# Patient Record
Sex: Male | Born: 1950 | Race: White | Hispanic: No | Marital: Single | Smoking: Current every day smoker
Health system: Southern US, Community
[De-identification: ages and names within clinical notes are randomized; demographics above are authoritative.]

## PROBLEM LIST (undated history)

## (undated) DIAGNOSIS — M5126 Other intervertebral disc displacement, lumbar region: Secondary | ICD-10-CM

## (undated) DIAGNOSIS — M1990 Unspecified osteoarthritis, unspecified site: Secondary | ICD-10-CM

## (undated) DIAGNOSIS — J449 Chronic obstructive pulmonary disease, unspecified: Secondary | ICD-10-CM

## (undated) DIAGNOSIS — E7800 Pure hypercholesterolemia, unspecified: Secondary | ICD-10-CM

## (undated) DIAGNOSIS — I10 Essential (primary) hypertension: Secondary | ICD-10-CM

## (undated) DIAGNOSIS — K219 Gastro-esophageal reflux disease without esophagitis: Secondary | ICD-10-CM

## (undated) DIAGNOSIS — I639 Cerebral infarction, unspecified: Secondary | ICD-10-CM

## (undated) DIAGNOSIS — I509 Heart failure, unspecified: Secondary | ICD-10-CM

## (undated) DIAGNOSIS — J42 Unspecified chronic bronchitis: Secondary | ICD-10-CM

## (undated) DIAGNOSIS — Z9289 Personal history of other medical treatment: Secondary | ICD-10-CM

## (undated) DIAGNOSIS — M5136 Other intervertebral disc degeneration, lumbar region: Secondary | ICD-10-CM

## (undated) HISTORY — PX: TONSILLECTOMY: SUR1361

---

## 2008-12-30 DIAGNOSIS — Z9289 Personal history of other medical treatment: Secondary | ICD-10-CM

## 2008-12-30 HISTORY — DX: Personal history of other medical treatment: Z92.89

## 2011-12-31 HISTORY — PX: CARDIAC CATHETERIZATION: SHX172

## 2014-04-29 HISTORY — PX: COLONOSCOPY: SHX174

## 2014-10-12 ENCOUNTER — Emergency Department (HOSPITAL_COMMUNITY): Payer: Medicaid Other

## 2014-10-12 ENCOUNTER — Inpatient Hospital Stay (HOSPITAL_COMMUNITY)
Admission: EM | Admit: 2014-10-12 | Discharge: 2014-10-28 | DRG: 286 | Disposition: A | Discharge status: Home Health | Payer: Medicaid Other | Attending: Internal Medicine | Admitting: Internal Medicine

## 2014-10-12 ENCOUNTER — Encounter (HOSPITAL_COMMUNITY): Payer: Self-pay | Admitting: Emergency Medicine

## 2014-10-12 DIAGNOSIS — E43 Unspecified severe protein-calorie malnutrition: Secondary | ICD-10-CM | POA: Diagnosis present

## 2014-10-12 DIAGNOSIS — Z66 Do not resuscitate: Secondary | ICD-10-CM | POA: Diagnosis present

## 2014-10-12 DIAGNOSIS — Z720 Tobacco use: Secondary | ICD-10-CM

## 2014-10-12 DIAGNOSIS — E780 Pure hypercholesterolemia: Secondary | ICD-10-CM | POA: Diagnosis present

## 2014-10-12 DIAGNOSIS — R000 Tachycardia, unspecified: Secondary | ICD-10-CM

## 2014-10-12 DIAGNOSIS — I5023 Acute on chronic systolic (congestive) heart failure: Secondary | ICD-10-CM | POA: Diagnosis present

## 2014-10-12 DIAGNOSIS — Z681 Body mass index (BMI) 19 or less, adult: Secondary | ICD-10-CM

## 2014-10-12 DIAGNOSIS — M549 Dorsalgia, unspecified: Secondary | ICD-10-CM | POA: Diagnosis present

## 2014-10-12 DIAGNOSIS — E871 Hypo-osmolality and hyponatremia: Secondary | ICD-10-CM | POA: Diagnosis not present

## 2014-10-12 DIAGNOSIS — F411 Generalized anxiety disorder: Secondary | ICD-10-CM | POA: Diagnosis present

## 2014-10-12 DIAGNOSIS — I2510 Atherosclerotic heart disease of native coronary artery without angina pectoris: Secondary | ICD-10-CM | POA: Diagnosis present

## 2014-10-12 DIAGNOSIS — J9601 Acute respiratory failure with hypoxia: Secondary | ICD-10-CM

## 2014-10-12 DIAGNOSIS — M79669 Pain in unspecified lower leg: Secondary | ICD-10-CM

## 2014-10-12 DIAGNOSIS — R64 Cachexia: Secondary | ICD-10-CM | POA: Diagnosis present

## 2014-10-12 DIAGNOSIS — I10 Essential (primary) hypertension: Secondary | ICD-10-CM | POA: Diagnosis present

## 2014-10-12 DIAGNOSIS — R0602 Shortness of breath: Secondary | ICD-10-CM | POA: Diagnosis present

## 2014-10-12 DIAGNOSIS — F419 Anxiety disorder, unspecified: Secondary | ICD-10-CM | POA: Diagnosis present

## 2014-10-12 DIAGNOSIS — R570 Cardiogenic shock: Secondary | ICD-10-CM | POA: Diagnosis present

## 2014-10-12 DIAGNOSIS — J9600 Acute respiratory failure, unspecified whether with hypoxia or hypercapnia: Secondary | ICD-10-CM | POA: Diagnosis present

## 2014-10-12 DIAGNOSIS — J449 Chronic obstructive pulmonary disease, unspecified: Secondary | ICD-10-CM | POA: Diagnosis present

## 2014-10-12 DIAGNOSIS — Z23 Encounter for immunization: Secondary | ICD-10-CM | POA: Diagnosis not present

## 2014-10-12 DIAGNOSIS — D509 Iron deficiency anemia, unspecified: Secondary | ICD-10-CM | POA: Diagnosis present

## 2014-10-12 DIAGNOSIS — Z515 Encounter for palliative care: Secondary | ICD-10-CM

## 2014-10-12 DIAGNOSIS — I429 Cardiomyopathy, unspecified: Secondary | ICD-10-CM | POA: Diagnosis present

## 2014-10-12 DIAGNOSIS — R911 Solitary pulmonary nodule: Secondary | ICD-10-CM | POA: Diagnosis present

## 2014-10-12 DIAGNOSIS — Z5987 Material hardship due to limited financial resources, not elsewhere classified: Secondary | ICD-10-CM

## 2014-10-12 DIAGNOSIS — R109 Unspecified abdominal pain: Secondary | ICD-10-CM

## 2014-10-12 DIAGNOSIS — F17210 Nicotine dependence, cigarettes, uncomplicated: Secondary | ICD-10-CM | POA: Diagnosis present

## 2014-10-12 DIAGNOSIS — Z452 Encounter for adjustment and management of vascular access device: Secondary | ICD-10-CM

## 2014-10-12 DIAGNOSIS — K219 Gastro-esophageal reflux disease without esophagitis: Secondary | ICD-10-CM | POA: Diagnosis present

## 2014-10-12 DIAGNOSIS — R1031 Right lower quadrant pain: Secondary | ICD-10-CM

## 2014-10-12 DIAGNOSIS — G8929 Other chronic pain: Secondary | ICD-10-CM | POA: Diagnosis present

## 2014-10-12 DIAGNOSIS — I69351 Hemiplegia and hemiparesis following cerebral infarction affecting right dominant side: Secondary | ICD-10-CM | POA: Diagnosis not present

## 2014-10-12 DIAGNOSIS — Z598 Other problems related to housing and economic circumstances: Secondary | ICD-10-CM

## 2014-10-12 DIAGNOSIS — R0600 Dyspnea, unspecified: Secondary | ICD-10-CM | POA: Diagnosis present

## 2014-10-12 DIAGNOSIS — I509 Heart failure, unspecified: Secondary | ICD-10-CM

## 2014-10-12 HISTORY — DX: Personal history of other medical treatment: Z92.89

## 2014-10-12 HISTORY — DX: Other intervertebral disc displacement, lumbar region: M51.26

## 2014-10-12 HISTORY — DX: Pure hypercholesterolemia, unspecified: E78.00

## 2014-10-12 HISTORY — DX: Heart failure, unspecified: I50.9

## 2014-10-12 HISTORY — DX: Essential (primary) hypertension: I10

## 2014-10-12 HISTORY — DX: Gastro-esophageal reflux disease without esophagitis: K21.9

## 2014-10-12 HISTORY — DX: Chronic obstructive pulmonary disease, unspecified: J44.9

## 2014-10-12 HISTORY — DX: Unspecified chronic bronchitis: J42

## 2014-10-12 HISTORY — DX: Other intervertebral disc degeneration, lumbar region: M51.36

## 2014-10-12 HISTORY — DX: Cerebral infarction, unspecified: I63.9

## 2014-10-12 HISTORY — DX: Unspecified osteoarthritis, unspecified site: M19.90

## 2014-10-12 LAB — LIPID PANEL
CHOL/HDL RATIO: 5.6 ratio
Cholesterol: 173 mg/dL (ref 0–200)
HDL: 31 mg/dL — ABNORMAL LOW (ref >39)
LDL Cholesterol: 123 mg/dL — ABNORMAL HIGH (ref 0–99)
TRIGLYCERIDES: 93 mg/dL (ref <150)
VLDL: 19 mg/dL (ref 0–40)

## 2014-10-12 LAB — BASIC METABOLIC PANEL
Anion gap: 16 — ABNORMAL HIGH (ref 5–15)
BUN: 26 mg/dL — AB (ref 6–23)
CALCIUM: 9.1 mg/dL (ref 8.4–10.5)
CO2: 20 meq/L (ref 19–32)
Chloride: 103 mEq/L (ref 96–112)
Creatinine, Ser: 0.97 mg/dL (ref 0.50–1.35)
GFR calc Af Amer: >90 mL/min (ref >90)
GFR, EST NON AFRICAN AMERICAN: 86 mL/min — AB (ref >90)
GLUCOSE: 79 mg/dL (ref 70–99)
Potassium: 4.6 mEq/L (ref 3.7–5.3)
Sodium: 139 mEq/L (ref 137–147)

## 2014-10-12 LAB — CBC
HCT: 33.1 % — ABNORMAL LOW (ref 39.0–52.0)
HEMOGLOBIN: 10.4 g/dL — AB (ref 13.0–17.0)
MCH: 23.2 pg — AB (ref 26.0–34.0)
MCHC: 31.4 g/dL (ref 30.0–36.0)
MCV: 73.9 fL — ABNORMAL LOW (ref 78.0–100.0)
Platelets: 294 10*3/uL (ref 150–400)
RBC: 4.48 MIL/uL (ref 4.22–5.81)
RDW: 20.3 % — ABNORMAL HIGH (ref 11.5–15.5)
WBC: 9.9 10*3/uL (ref 4.0–10.5)

## 2014-10-12 LAB — HEPATIC FUNCTION PANEL
ALK PHOS: 113 U/L (ref 39–117)
ALT: 19 U/L (ref 0–53)
AST: 19 U/L (ref 0–37)
Albumin: 3.1 g/dL — ABNORMAL LOW (ref 3.5–5.2)
Total Bilirubin: 0.4 mg/dL (ref 0.3–1.2)
Total Protein: 7.4 g/dL (ref 6.0–8.3)

## 2014-10-12 LAB — RAPID URINE DRUG SCREEN, HOSP PERFORMED
AMPHETAMINES: NOT DETECTED
Barbiturates: NOT DETECTED
Benzodiazepines: NOT DETECTED
Cocaine: NOT DETECTED
Opiates: NOT DETECTED
Tetrahydrocannabinol: POSITIVE — AB

## 2014-10-12 LAB — URINALYSIS, ROUTINE W REFLEX MICROSCOPIC
Bilirubin Urine: NEGATIVE
Glucose, UA: NEGATIVE mg/dL
Hgb urine dipstick: NEGATIVE
Ketones, ur: NEGATIVE mg/dL
LEUKOCYTES UA: NEGATIVE
Nitrite: NEGATIVE
PROTEIN: NEGATIVE mg/dL
Specific Gravity, Urine: 1.014 (ref 1.005–1.030)
UROBILINOGEN UA: 0.2 mg/dL (ref 0.0–1.0)
pH: 5.5 (ref 5.0–8.0)

## 2014-10-12 LAB — I-STAT TROPONIN, ED: Troponin i, poc: 0.03 ng/mL (ref 0.00–0.08)

## 2014-10-12 LAB — PRO B NATRIURETIC PEPTIDE: Pro B Natriuretic peptide (BNP): 20243 pg/mL — ABNORMAL HIGH (ref 0–125)

## 2014-10-12 LAB — LIPASE, BLOOD: Lipase: 15 U/L (ref 11–59)

## 2014-10-12 LAB — LACTIC ACID, PLASMA: Lactic Acid, Venous: 2 mmol/L (ref 0.5–2.2)

## 2014-10-12 LAB — TROPONIN I

## 2014-10-12 MED ORDER — ONDANSETRON HCL 4 MG/2ML IJ SOLN
4.0000 mg | Freq: Four times a day (QID) | INTRAMUSCULAR | Status: DC | PRN
  Administered 2014-10-13: 4 mg via INTRAVENOUS
  Filled 2014-10-12: qty 2

## 2014-10-12 MED ORDER — LISINOPRIL 2.5 MG PO TABS: 2.5000 mg | ORAL_TABLET | Freq: Every day | ORAL | Status: DC

## 2014-10-12 MED ORDER — GUAIFENESIN 100 MG/5ML PO SYRP
200.0000 mg | ORAL_SOLUTION | ORAL | Status: DC | PRN
  Administered 2014-10-12: 200 mg via ORAL
  Filled 2014-10-12: qty 10

## 2014-10-12 MED ORDER — PANTOPRAZOLE SODIUM 40 MG PO TBEC
40.0000 mg | DELAYED_RELEASE_TABLET | Freq: Every day | ORAL | Status: DC
  Administered 2014-10-13 – 2014-10-28 (×16): 40 mg via ORAL
  Filled 2014-10-12 (×16): qty 1

## 2014-10-12 MED ORDER — POTASSIUM CHLORIDE CRYS ER 20 MEQ PO TBCR
20.0000 meq | EXTENDED_RELEASE_TABLET | Freq: Two times a day (BID) | ORAL | Status: DC
  Administered 2014-10-12: 20 meq via ORAL
  Filled 2014-10-12 (×3): qty 1

## 2014-10-12 MED ORDER — CARVEDILOL 3.125 MG PO TABS
3.1250 mg | ORAL_TABLET | Freq: Two times a day (BID) | ORAL | Status: DC
  Administered 2014-10-12: 3.125 mg via ORAL
  Filled 2014-10-12 (×2): qty 1

## 2014-10-12 MED ORDER — SIMVASTATIN 40 MG PO TABS
40.0000 mg | ORAL_TABLET | Freq: Every day | ORAL | Status: DC
  Administered 2014-10-12 – 2014-10-15 (×4): 40 mg via ORAL
  Filled 2014-10-12 (×5): qty 1

## 2014-10-12 MED ORDER — HYDROCODONE-ACETAMINOPHEN 10-325 MG PO TABS
1.0000 | ORAL_TABLET | Freq: Three times a day (TID) | ORAL | Status: DC | PRN
  Administered 2014-10-12: 1 via ORAL
  Filled 2014-10-12: qty 1

## 2014-10-12 MED ORDER — ALBUTEROL SULFATE (2.5 MG/3ML) 0.083% IN NEBU: 2.5000 mg | INHALATION_SOLUTION | Freq: Four times a day (QID) | RESPIRATORY_TRACT | Status: DC | PRN

## 2014-10-12 MED ORDER — INFLUENZA VAC SPLIT QUAD 0.5 ML IM SUSY
0.5000 mL | PREFILLED_SYRINGE | INTRAMUSCULAR | Status: AC
  Administered 2014-10-13: 0.5 mL via INTRAMUSCULAR
  Filled 2014-10-12: qty 0.5

## 2014-10-12 MED ORDER — FUROSEMIDE 10 MG/ML IJ SOLN
40.0000 mg | Freq: Once | INTRAMUSCULAR | Status: AC
Start: 2014-10-12 — End: 2014-10-12
  Administered 2014-10-12: 40 mg via INTRAVENOUS
  Filled 2014-10-12: qty 4

## 2014-10-12 MED ORDER — MOMETASONE FURO-FORMOTEROL FUM 100-5 MCG/ACT IN AERO
2.0000 | INHALATION_SPRAY | Freq: Two times a day (BID) | RESPIRATORY_TRACT | Status: DC
  Administered 2014-10-12: 2 via RESPIRATORY_TRACT
  Filled 2014-10-12: qty 8.8

## 2014-10-12 MED ORDER — NICOTINE 7 MG/24HR TD PT24
7.0000 mg | MEDICATED_PATCH | Freq: Every day | TRANSDERMAL | Status: DC
  Administered 2014-10-12: 7 mg via TRANSDERMAL
  Filled 2014-10-12 (×2): qty 1

## 2014-10-12 MED ORDER — FUROSEMIDE 10 MG/ML IJ SOLN
40.0000 mg | Freq: Once | INTRAMUSCULAR | Status: AC
  Administered 2014-10-12: 40 mg via INTRAVENOUS

## 2014-10-12 NOTE — ED Notes (Signed)
Attempted to call report x 1  

## 2014-10-12 NOTE — ED Notes (Signed)
Pt having 6 days of SOB. sts hx of heart failure and COPD. sts also slight chest pain.

## 2014-10-12 NOTE — ED Notes (Signed)
Dr. Mingo Amber at bedside

## 2014-10-12 NOTE — H&P (Signed)
[facility] Admission History and Physical   Patient name: Leroy Kennedy Medical record number: [id number] Date of birth: 1951/05/03 Age: 63 y.o. Gender: male  Primary Care Provider: PROVIDER NOT [place] admission Consultants: None Code Status: DNR per discussion with patient  Chief Complaint: SOB/cough/sternal CP  Assessment and Plan: Koltin Wehmeyer is a 63 y.o. male presenting with a cc of SOB/cough, and sternal CP . PMH is significant for CHF with subjectively reported EF of 35%, COPD, Depression, and back pain.  Most likely d/t CHF exacerbation.  1) SOB/Cough most likely d/t CHF exacerbation BNP 20,000+, tachy at 120s, exam findings with crackles bilateral bases, chest x-ray showing cardiac enlargement and mild scarring in lung bases. DDx also includes ACS (see below), COPD exacerbation though exam findings not consistent with this. Also must consider PE with recent drive to [place] from [place], though no LE edema or O2 desaturations. Also consider anxiety and anemia, though pt appears calm in ED and hgb not markedly low. - Admit to [facility] inpatient telemetry, attending Dr Mingo Amber - Received 1 dose IV 40mg  Lasix in ED.  Will give another dose overnight. F/u tomorrow and titrate to effect. - Continue home medication Lisinopril, Carvedilol, and Kdur. - F/u BMET tomorrow. - Will get Echo to evaluate EF and CHF status.  - Will start cough syrup for cough supression  - Will contact radiology to clarify x-ray with mild scarring/right lower lung base abnormality. - Continuous pulse oximetry. PRN supplemental O2. - Strict intake:output and daily standing weights - Monitor status closely, and if persistently tachycardic, begins being hypoxemic, or dyspnea does not improve with diuresis, would strongly consider CTA chest to eval for PE. - UDS  2) Sternal Chest Pain r/o with abnormal EKG with widened QRS and no discernable ST  elevations and interventricular delays. HEART score 5 (age, moderately suspicious symptoms, nonspecific repolarization abnormalities on EKG, >=3 risk factors with HTN, HLD, and tobacco abuse). POC troponin neg x 1. "Heart racing" sensation concerning for an arrhythmia. - Telemetry - Repeat EKG - Trend Troponins - Admitted under chest pain order set. Will await cardiology recs, greatly appreciated. - Will attempt to obtain records from PCP tomorrow (Dr Mina Marble [phone]).  - Risk stratification labs. Consider changing to high intensity statin based on this or PCP records. For now, continue simvastatin.  - Hold aspirin given blood in stool reported.  3) Abdominal pain of unknown etiology with hgb of 10.4 and MCV of 73.9. RUQ pain, reports tenderness on exam but no rebounding and abdomen does not appear acute. With nausea, consider pancreatitis, hepatobiliary process, and with age and symptoms out of proportion to exam, consider mesenteric ischemia. Reports bowel movements are regular but do have some blood tinged occasionally. - Will get Lipase, hepatic function panel, and Lactic Acid to evaluate - Consider abdominal ultrasound if abnormal values or worsening symptoms in the morning. - Serial abdominal exam. CT abdomen if acute abdomen develops.  - Continue to follow hgb to evaluate for bleeding - Zofran PRN nausea; continue home PPI  4) COPD Chronic, stable, does not appear exacerbated at this time. CXR appears hyperinflated. - Continue home meds of Advair and Albuterol PRN - Monitor O2 sats  - Start nicotine patch  5) Back Pain d/t slipped disc Chronic. On norco at home.  - Continue norco PRN pain, extended from q6prn to q8prn. - Monitor pain level  6) Tobacco abuse - Nicotine patch - briefly  discussed cessation.  7) Weight loss Reports 40 lb weight loss over the past few months. Would greatly benefit from workup of this including age-appropriate cancer screening.  - Again, PCP  records will help elucidate. - Needs PCP in [place]. On D/C, will provide with list of options.  8) Prolonged QT Seen on EKG, no prior to compare. - Repeat and if still prolonged, consider medication contributors  FEN/GI: Heart healthy diet after trop neg x 1 Prophylaxis: SCD due to reported bloody stool. Will change to SQ heparin if no further bloody stool here.  Disposition: Patient stable.  Pending CP r/o, diuresis and improvement of SOB/cough.  History of Present Illness: Leroy Kennedy is a 63 y.o. male presenting with 6 days of SOB, slightly productive cough, and substernal chest pain. He has had dyspnea since 2013 but it has gotten markedly worse over the past 6 days with difficulty even walking 6 feet. Has also had 8/10 chest pain substernally that feels like a dull 'lingering' pain and radiates down his back and to left shoulder and is worse with exertion. Reports a bad cough and thinks he picked up a cold. Coughing worsens pain and is minimally productive with greenish sputum initially, which is now whitish, nonbloody. Feels like struggling to breathe. He has not slept because has to sit up at night to breathe. Has not tried anything to make it better. Has episodes where heart races fast over the past 6 days and he has more trouble breathing, and all of the sudden he has to urinate "really quick". Reports no LE swelling but legs feel "heavy." Recently drove to [place] from [place] and was out of lasix for 2 days. Regularly takes coreg, simvastatin, lisinopril, and potassium. Was taking lisinopril 40mg  daily, though rx'ed BID. He thinks he had some vascular procedure of his heart that "looked good" but is not sure if it was a heart cath. No fainting but has had mild dizziness with chest pain.   Also complains of severe abdominal pain, worse than normal in right upper quadrant and lower abdomen, x6 days that occurs when feels heart racing" feel like i'm going to die." Gets up 1-2 times nightly to pee.  Omeprazole did not help. Occasionally worse with eating. Denies black stool or vomitus. Has also had nausea with emesis that is nonbloody. Denies diarrhea. Has had some regular blood in bowel movements x 2 months ago, once daily for 1 week, in bowl and on paper, just a little bit of blood. Now stools once daily and unclear if he is still having occasionally blood in BM. Thinks had colonoscopy 2015 May, saw "1 bad thing" he thinks was a polyp. Denies fevers or chills. Reports 40 lb weight loss over 5-6 mo.   Pt reports that CHF was diagnosed April 2013 at [facility] in [place]. Never saw cardiologist in [place]. Had mail-order pharmacy and rx written by Dr Mina Marble [phone] - primary care and "something to do with the chest"). Thinks he remembers something about "35%" heart function.  In the ED, he received lasix 40mg  IV x 1.   Review Of Systems: Per HPI with the following additions:  Otherwise 12 point review of systems was performed and was unremarkable.  Patient Active Problem List   Diagnosis Date Noted  . CHF exacerbation 10/12/2014   Past Medical History: Past Medical History  Diagnosis Date  . COPD (chronic obstructive pulmonary disease)   . CHF (congestive heart failure)   Takes sertraline though  unsure of miligrams.   Has h/o COPD and takes advair BID though rx'ed just once daily, with no missed doses. He has not needed albuterol very often, not needed. Feels he wheezes. He thinks had echo 8 years ago.   Past Surgical History: Past Surgical History  Procedure Laterality Date  . Cardiac catherization     Social History: History  Substance Use Topics  . Smoking status: Current Every Day Smoker  . Smokeless tobacco: Not on file  . Alcohol Use: Yes   Additional social history: Just moved to [facility] from [place] because of thinking it would be better for his life and wanting to "get out." Left 1.5 weeks ago.  Lives with friend Edmonia Lynch.  Smokes 1/2 PPD; has tried  quitting before.  Drinks 1 beer or 2 every couple days  Never drank much  Denies drug use, no h/o IVDU   Please also refer to relevant sections of EMR.  Family History: History reviewed. No pertinent family history. Allergies and Medications: No Known Allergies No current facility-administered medications on file prior to encounter.   No current outpatient prescriptions on file prior to encounter.    Objective: BP 107/71  Pulse 109  Temp(Src) 97.9 F (36.6 C)  Resp 18  Wt 134 lb 4 oz (60.895 kg)  SpO2 100% Exam: General: Lying in bed, slumped fwd HEENT: AT/[place], poor / little dentition, o/p clear, PERRL, EOMI  Cardiovascular: tachycardia 110s, JVD to 8 cm? No murmurs. Regular rhythm. Respiratory: CTAB except bases with moderate crackles bilaterally; dull to percussion bilateral bases; hyperinflated Abdomen: S/NT/ND, no guarding or rebound, chatting with interviewer during exam, later reports it was painful, no organomegaly appreciated Extremities: No LE edema or calf tenderness Skin: Arms with nodules scabbed and spaced intermittently  Neuro: Awake, alert, normal speech, Grossly nl  Back: Kyphosis, excoriations mid-back  Labs and Imaging: CBC BMET   Recent Labs Lab 10/12/14 1316  WBC 9.9  HGB 10.4*  HCT 33.1*  PLT 294    Recent Labs Lab 10/12/14 1316  NA 139  K 4.6  CL 103  CO2 20  BUN 26*  CREATININE 0.97  GLUCOSE 79  CALCIUM 9.1     - Chest x-ray showed ardiac enlargement without heart failure. Mild COPD with  hyperinflation of the lungs and mild scarring in the bases. Negative  for pneumonia or effusion.  EKG: prolonged QT 526, tachycardia, intraventricular conduction delay, RAD, atrial enlargement, early repol   Doroteo Bradford, Med Student 10/12/2014, 5:18 PM  Completed by Hilton Sinclair, MD PGY-3, [facility] 10/12/2014 6:33 PM

## 2014-10-12 NOTE — Progress Notes (Signed)
UR completed Adreona Brand K. Kanoelani Dobies, RN, BSN, [place], CCM  10/12/2014 8:47 PM

## 2014-10-12 NOTE — ED Provider Notes (Signed)
CSN: [id number]     Arrival date & time 10/12/14  1256 History   First MD Initiated Contact with Patient 10/12/14 1329     Chief Complaint  Patient presents with  . Shortness of Breath     (Consider location/radiation/quality/duration/timing/severity/associated sxs/prior Treatment) Patient is a 63 y.o. male presenting with shortness of breath. The history is provided by the patient.  Shortness of Breath Severity:  Moderate Onset quality:  Gradual Duration:  6 days Timing:  Constant Progression:  Worsening Chronicity:  Recurrent Context: not URI   Relieved by:  Nothing Worsened by:  Nothing tried Associated symptoms: no abdominal pain, no cough, no fever and no vomiting     Past Medical History  Diagnosis Date  . COPD (chronic obstructive pulmonary disease)   . CHF (congestive heart failure)    History reviewed. No pertinent past surgical history. History reviewed. No pertinent family history. History  Substance Use Topics  . Smoking status: Current Every Day Smoker  . Smokeless tobacco: Not on file  . Alcohol Use: Yes    Review of Systems  Constitutional: Negative for fever.  Respiratory: Negative for cough and shortness of breath.   Gastrointestinal: Negative for vomiting and abdominal pain.  All other systems reviewed and are negative.     Allergies  Review of patient's allergies indicates no known allergies.  Home Medications   Prior to Admission medications   Medication Sig Start Date End Date Taking? Authorizing Provider  furosemide (LASIX) 40 MG tablet Take 40 mg by mouth 2 (two) times daily.   Yes Historical Provider, MD   BP 107/73  Pulse 117  Temp(Src) 97.9 F (36.6 C)  Resp 26  Wt 134 lb 4 oz (60.895 kg)  SpO2 100% Physical Exam  Nursing note and vitals reviewed. Constitutional: He is oriented to person, place, and time. He appears well-developed and well-nourished. No distress.  HENT:  Head: Normocephalic and atraumatic.  Mouth/Throat:  Oropharynx is clear and moist. No oropharyngeal exudate.  Eyes: EOM are normal. Pupils are equal, round, and reactive to light.  Neck: Normal range of motion. Neck supple. JVD (mild) present.  Cardiovascular: Normal rate and regular rhythm.  Exam reveals no friction rub.   No murmur heard. Pulmonary/Chest: Effort normal and breath sounds normal. No respiratory distress. He has no wheezes. He has no rales.  Abdominal: Soft. He exhibits no distension. There is no tenderness. There is no rebound.  Musculoskeletal: Normal range of motion. He exhibits no edema.  Neurological: He is alert and oriented to person, place, and time.  Skin: No rash noted. He is not diaphoretic.    ED Course  Procedures (including critical care time) Labs Review Labs Reviewed  CBC - Abnormal; Notable for the following:    Hemoglobin 10.4 (*)    HCT 33.1 (*)    MCV 73.9 (*)    MCH 23.2 (*)    RDW 20.3 (*)    All other components within normal limits  BASIC METABOLIC PANEL - Abnormal; Notable for the following:    BUN 26 (*)    GFR calc non Af Amer 86 (*)    Anion gap 16 (*)    All other components within normal limits  PRO B NATRIURETIC PEPTIDE - Abnormal; Notable for the following:    Pro B Natriuretic peptide (BNP) 20243.0 (*)    All other components within normal limits  URINALYSIS, ROUTINE W REFLEX MICROSCOPIC  I-STAT TROPOININ, ED    Imaging Review No results found.  EKG Interpretation   Date/Time:  Wednesday October 12 2014 13:07:43 EDT Ventricular Rate:  112 PR Interval:  154 QRS Duration: 134 QT Interval:  386 QTC Calculation: 526 R Axis:   176 Text Interpretation:  Sinus tachycardia Right atrial enlargement Right  axis deviation Non-specific intra-ventricular conduction block Cannot rule  out Anterior infarct , age undetermined Abnormal ECG No prior for  comparison Confirmed by [facility]  MD, [place] ([id number]) on 10/12/2014 1:33:51 PM      MDM   Final diagnoses:  CHF exacerbation   Shortness of breath    27M with hx of CHF and COPD presents with SOB. Present for past 6 days, no fevers, no vomiting. Occasional cough, nonproductive. Having some mild CP. Just moved here from [place]. No PCP or Cardiologist established yet. BNP elevated, admitted to [facility].   Evelina Bucy, MD 10/12/14 [phone]

## 2014-10-12 NOTE — Care Management Note (Addendum)
    Page 1 of 2   10/27/2014     10:44:09 AM CARE MANAGEMENT NOTE 10/27/2014  Patient:  Leroy Kennedy, Leroy Kennedy   Account Number:  [id number]  Date Initiated:  10/12/2014  Documentation initiated by:  HUTCHINSON,CRYSTAL  Subjective/Objective Assessment:   CHF     Action/Plan:   CM to follow for disposition needs   Anticipated DC Date:     Anticipated DC Plan:  [place] referral  Clinical Social Worker      DC Forensic scientist  CM consult  [facility]      [facility] Choice  [place]   Choice offered to / List presented to:  C-1 Patient   DME arranged  IV PUMP/EQUIPMENT      DME agency  [place] arranged  HH-1 RN  [place].   Status of service:  Completed, signed off Medicare Important Message given?   (If response is "NO", the following Medicare IM given date fields will be blank) Date Medicare IM given:   Medicare IM given by:   Date Additional Medicare IM given:   Additional Medicare IM given by:    Discharge Disposition:  [place]  Per UR Regulation:  Reviewed for med. necessity/level of care/duration of stay  If discussed at [place] of Stay Meetings, dates discussed:   10/18/2014  10/20/2014  10/25/2014  10/27/2014    Comments:  10/29  1042 debbie Lutisha Knoche rn,bsn spoke w pam w [facility]. they are prepared to do home dobutamine and aware maybe dc on 10-30. pt has applied for medicaid but [facility] will take at disch.  10/21  1011 debbie Kristof Nadeem rn,bsn spoke w pt and went over [facility] agency list. no pref. he prefers to go home and not snf but phy ther has rec snf. have made ref to [facility] for home iv dobutamine. he states he has medicare. he states he has someone with him 24/7 at home.  10/15  1524 debbie Nicandro Perrault rn,bsn spoke w pt. he just moved here from [place]. states he has ins. did leave pt [facility]  clinic list and explained about [facility]. will cont to follow.  Crystal Hutchinson RN, BSN, MSHL, CCM  Nurse - Case Manager,  (Unit [phone]  10/12/2014 Social:  Hx/o moved to [place] within the past week from [place]:  Clarification needed CM consult pending.

## 2014-10-12 NOTE — ED Notes (Signed)
Family Medicine at bedside 

## 2014-10-12 NOTE — H&P (Signed)
FMTS Attending Admission Note: Annabell Sabal MD Personal pager:  [phone] FPTS Service Pager:  [phone]  I  have seen and examined this patient, reviewed their chart. I have discussed this patient with the resident. I agree with the resident's findings, assessment and care plan.  Additionally:  63 yo M recently moved to [place] within the past week from [place] who presents with about a week's worth of cough, dyspnea, palpitations, and fatigue.  Also endorses around a 40 lb weight loss in past 6 months.  Cough productive alertnatively of green and white sputum, occasionally dry.  Also with some BRBPR, though inconsistent for past month and not recently.  No melena or hematochezia.  Inconsistent stabbing Right sided abdominal pain as well.    Of note, he endorses known CHF diagnosis with EF of 25% diagnosed earlier this year.  Takes Lasix 40 mg daily, though increased this for past week to BID because felt he had "too much fluild."  No LE edema.  Exam: Gen:  Older than stated age appearing male, somewhat disheveled. HEENT:  PERRL, poor dental hygeine, few teeth. Neck:  JVD noted more on Left than Right side. Heart:  Tachycardic regular rhythm Lungs:  Decreased lung sounds Right base.  Dullness to percussion here as well.   Abd:  Nontender throughout on my exam.  Good BS Ext:  Thin, without edema.  Labs significant for BNP of >20K.  Microcytic anemia.  EKG with tachycardia, Left heart strain, intraventricular delay.    Imp/Plan: 1.  CHF exacerbation: - seems most likely diagnosis in this patient with palpitations, cough, and some mild crackles at bases plus elevated BNP and normal renal function - plan to increase home lasix and watch for diuresis - strict I/Os.  Daily weights.  - Needs echo.   - awaiting troponins as possible cause of exacerbation  2.  Microcytic anemia: - with known BRBPR - needs FOBT - attempt to obtain colonoscopy records  3.  COPD: - via history and  medication list - continue home meds.  Seems less likely to be COPD exacerbation.  I will sign resident note when completed.    Alveda Reasons, MD 10/12/2014 5:16 PM

## 2014-10-13 ENCOUNTER — Inpatient Hospital Stay (HOSPITAL_COMMUNITY): Payer: Medicaid Other

## 2014-10-13 DIAGNOSIS — I5023 Acute on chronic systolic (congestive) heart failure: Secondary | ICD-10-CM

## 2014-10-13 DIAGNOSIS — I10 Essential (primary) hypertension: Secondary | ICD-10-CM

## 2014-10-13 DIAGNOSIS — R1031 Right lower quadrant pain: Secondary | ICD-10-CM

## 2014-10-13 DIAGNOSIS — R0600 Dyspnea, unspecified: Secondary | ICD-10-CM

## 2014-10-13 DIAGNOSIS — R000 Tachycardia, unspecified: Secondary | ICD-10-CM

## 2014-10-13 DIAGNOSIS — E43 Unspecified severe protein-calorie malnutrition: Secondary | ICD-10-CM | POA: Diagnosis present

## 2014-10-13 DIAGNOSIS — R0602 Shortness of breath: Secondary | ICD-10-CM

## 2014-10-13 LAB — CBC
HCT: 35 % — ABNORMAL LOW (ref 39.0–52.0)
Hemoglobin: 11.3 g/dL — ABNORMAL LOW (ref 13.0–17.0)
MCH: 23.6 pg — ABNORMAL LOW (ref 26.0–34.0)
MCHC: 32.3 g/dL (ref 30.0–36.0)
MCV: 73.1 fL — ABNORMAL LOW (ref 78.0–100.0)
Platelets: 362 10*3/uL (ref 150–400)
RBC: 4.79 MIL/uL (ref 4.22–5.81)
RDW: 20.3 % — AB (ref 11.5–15.5)
WBC: 9.3 10*3/uL (ref 4.0–10.5)

## 2014-10-13 LAB — BASIC METABOLIC PANEL
ANION GAP: 16 — AB (ref 5–15)
BUN: 28 mg/dL — AB (ref 6–23)
CALCIUM: 9.2 mg/dL (ref 8.4–10.5)
CO2: 21 mEq/L (ref 19–32)
CREATININE: 1.17 mg/dL (ref 0.50–1.35)
Chloride: 100 mEq/L (ref 96–112)
GFR, EST AFRICAN AMERICAN: 75 mL/min — AB (ref >90)
GFR, EST NON AFRICAN AMERICAN: 65 mL/min — AB (ref >90)
Glucose, Bld: 112 mg/dL — ABNORMAL HIGH (ref 70–99)
Potassium: 4.5 mEq/L (ref 3.7–5.3)
Sodium: 137 mEq/L (ref 137–147)

## 2014-10-13 LAB — BLOOD GAS, ARTERIAL
ACID-BASE DEFICIT: 7.2 mmol/L — AB (ref 0.0–2.0)
Bicarbonate: 15.3 mEq/L — ABNORMAL LOW (ref 20.0–24.0)
Drawn by: 36989
FIO2: 0.28 %
O2 Saturation: 99.8 %
Patient temperature: 98.6
TCO2: 15.9 mmol/L (ref 0–100)
pCO2 arterial: 19 mmHg — CL (ref 35.0–45.0)
pH, Arterial: 7.517 — ABNORMAL HIGH (ref 7.350–7.450)
pO2, Arterial: 128 mmHg — ABNORMAL HIGH (ref 80.0–100.0)

## 2014-10-13 LAB — LACTATE DEHYDROGENASE: LDH: 312 U/L — ABNORMAL HIGH (ref 94–250)

## 2014-10-13 LAB — GLUCOSE, CAPILLARY: GLUCOSE-CAPILLARY: 90 mg/dL (ref 70–99)

## 2014-10-13 LAB — IRON AND TIBC
Iron: 38 ug/dL — ABNORMAL LOW (ref 42–135)
SATURATION RATIOS: 8 % — AB (ref 20–55)
TIBC: 456 ug/dL — ABNORMAL HIGH (ref 215–435)
UIBC: 418 ug/dL — ABNORMAL HIGH (ref 125–400)

## 2014-10-13 LAB — MRSA PCR SCREENING: MRSA by PCR: POSITIVE — AB

## 2014-10-13 LAB — CARBOXYHEMOGLOBIN
CARBOXYHEMOGLOBIN: 1.1 % (ref 0.5–1.5)
Methemoglobin: 1.1 % (ref 0.0–1.5)
O2 Saturation: 42.8 %
Total hemoglobin: 10.7 g/dL — ABNORMAL LOW (ref 13.5–18.0)

## 2014-10-13 LAB — CORTISOL: Cortisol, Plasma: 30.4 ug/dL

## 2014-10-13 LAB — HEMOGLOBIN A1C
HEMOGLOBIN A1C: 6.2 % — AB (ref <5.7)
MEAN PLASMA GLUCOSE: 131 mg/dL — AB (ref <117)

## 2014-10-13 LAB — PROCALCITONIN: Procalcitonin: <0.1 ng/mL

## 2014-10-13 LAB — TROPONIN I

## 2014-10-13 LAB — PROTIME-INR
INR: 1.37 (ref 0.00–1.49)
Prothrombin Time: 17 seconds — ABNORMAL HIGH (ref 11.6–15.2)

## 2014-10-13 LAB — D-DIMER, QUANTITATIVE (NOT AT ARMC): D DIMER QUANT: 15.79 ug{FEU}/mL — AB (ref 0.00–0.48)

## 2014-10-13 LAB — LACTIC ACID, PLASMA: LACTIC ACID, VENOUS: 3.7 mmol/L — AB (ref 0.5–2.2)

## 2014-10-13 IMAGING — CT CT ANGIO CHEST
1 series · 17 of 25 positions shown · IV contrast (Iodine)
Comparison: None.

CLINICAL DATA: Tachycardia and calf tenderness. Shortness of breath
and tachycardia. Diffuse abdominal pain. Initial encounter.

EXAM:
CT ANGIOGRAPHY CHEST, ABDOMEN AND PELVIS
TECHNIQUE: Multidetector CT imaging through the chest, abdomen and pelvis was
performed using the standard protocol during bolus administration of
intravenous contrast. Multiplanar reconstructed images and MIPs were
obtained and reviewed to evaluate the vascular anatomy.
CONTRAST:  100mL OMNIPAQUE IOHEXOL 350 MG/ML SOLN

[Series 601: kidney delay, idose (3) · axial · delayed · 0.65mm/px · z∈[+245,+355]mm · 17 of 25 slices shown]
[im 2/25  lung]
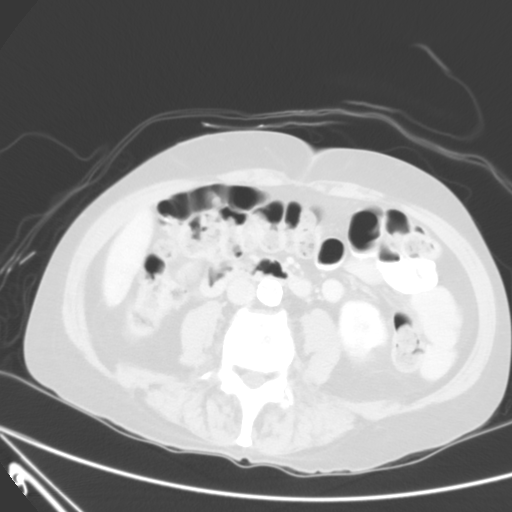
[im 4/25  soft-tissue]
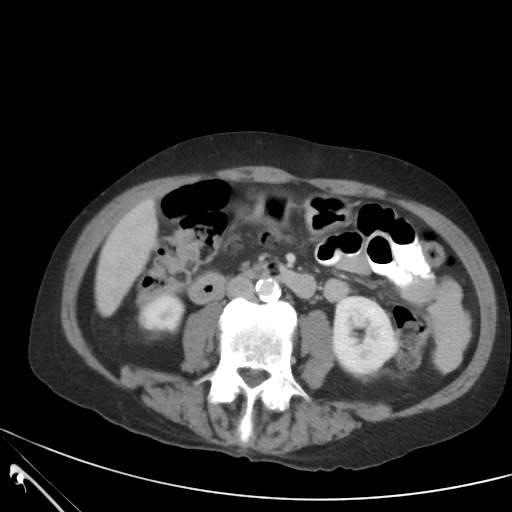
[im 5/25  lung]
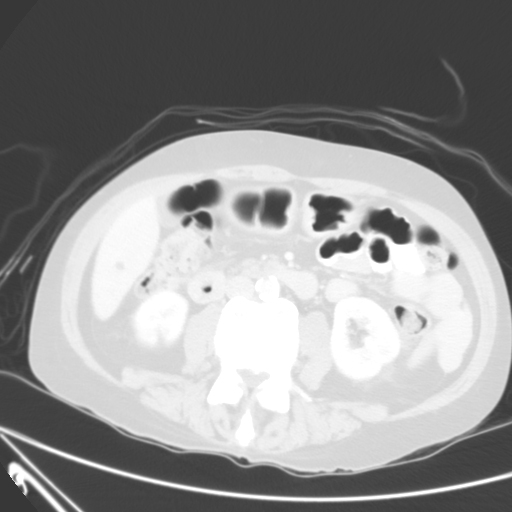
[im 6/25  soft-tissue]
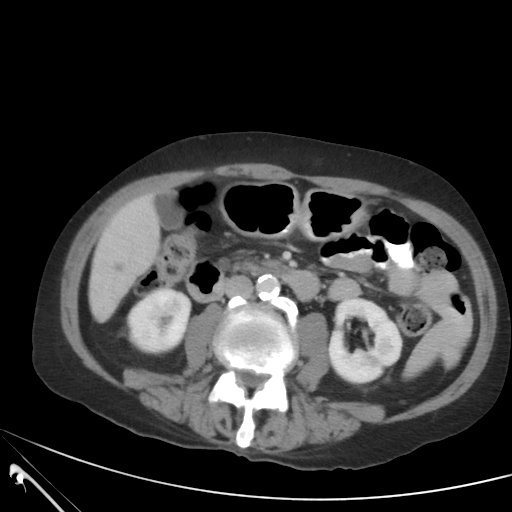
[im 8/25  lung]
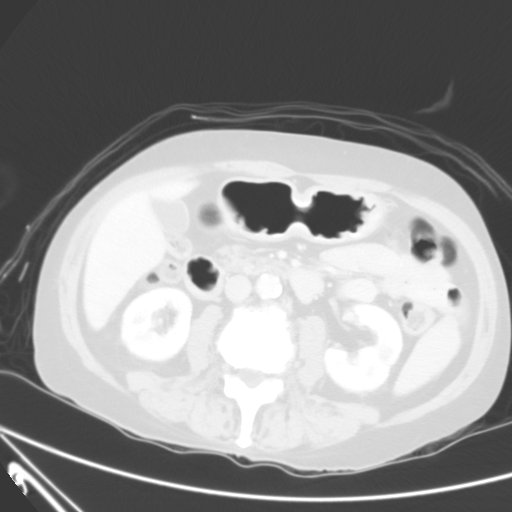
[im 9/25  soft-tissue]
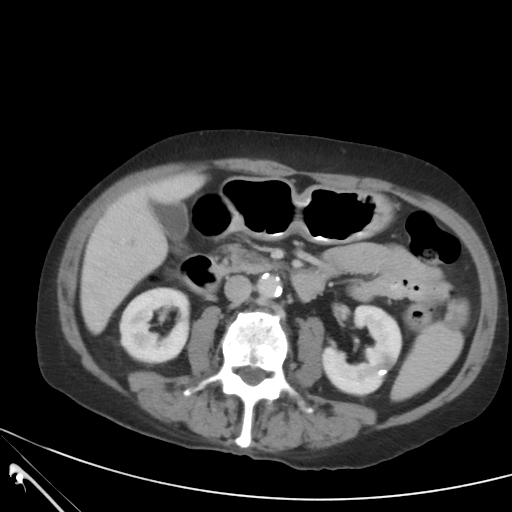
[im 10/25  lung]
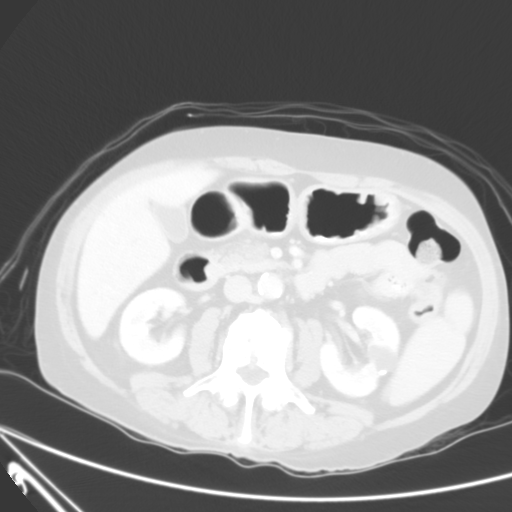
[im 12/25  soft-tissue]
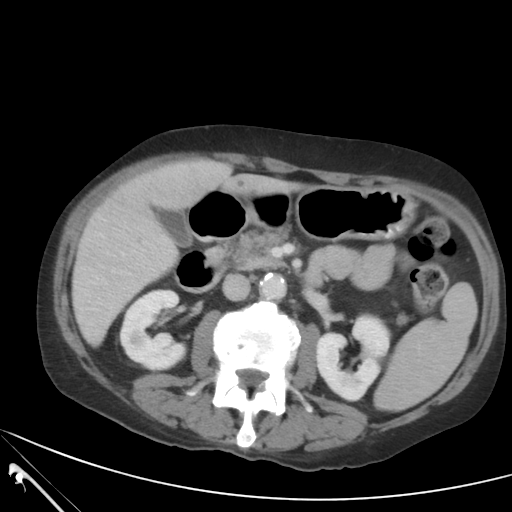
[im 13/25  lung]
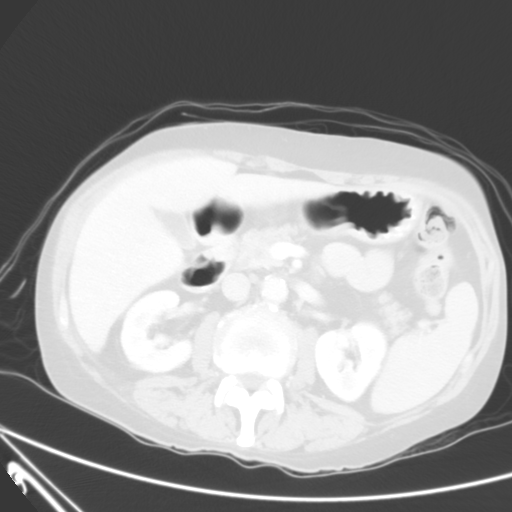
[im 14/25  soft-tissue]
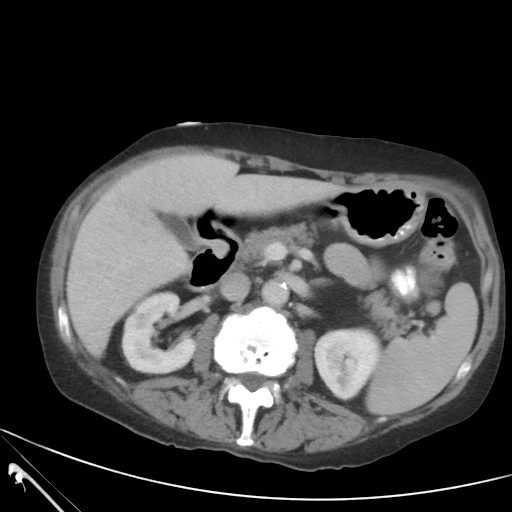
[im 16/25  lung]
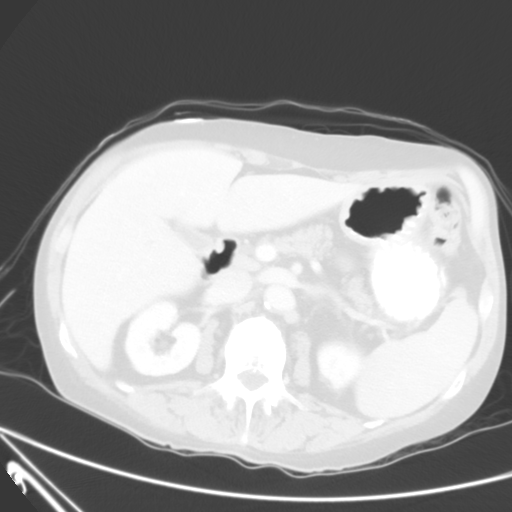
[im 17/25  soft-tissue]
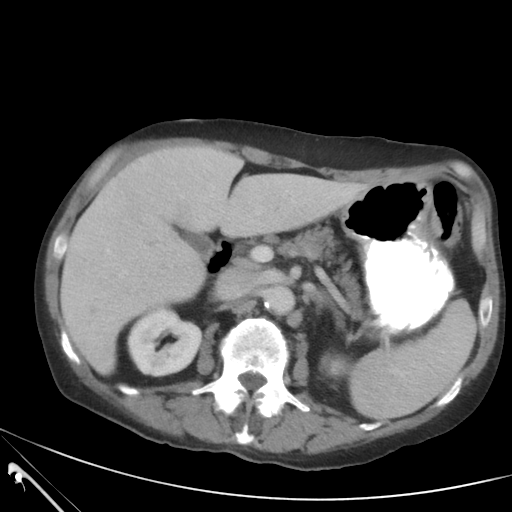
[im 18/25  lung]
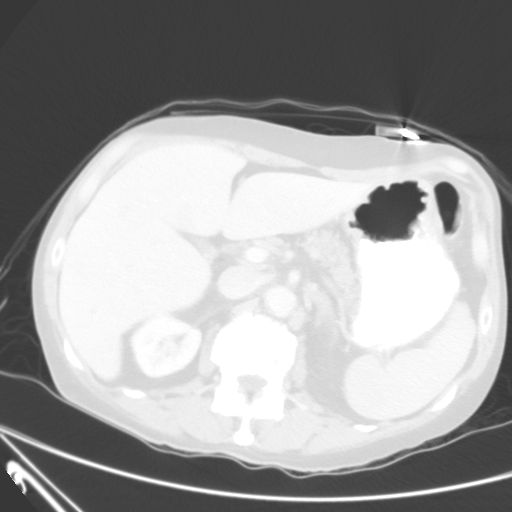
[im 20/25  soft-tissue]
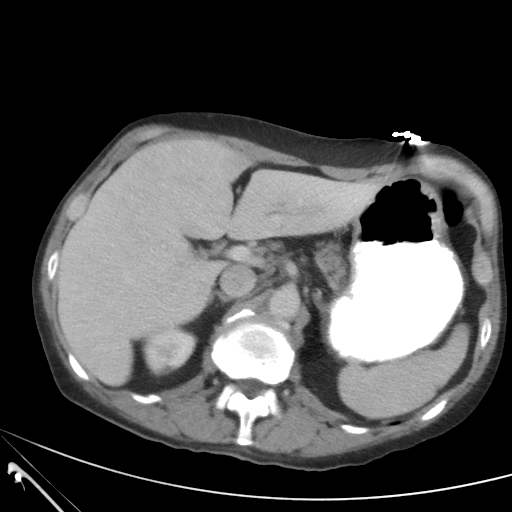
[im 21/25  lung]
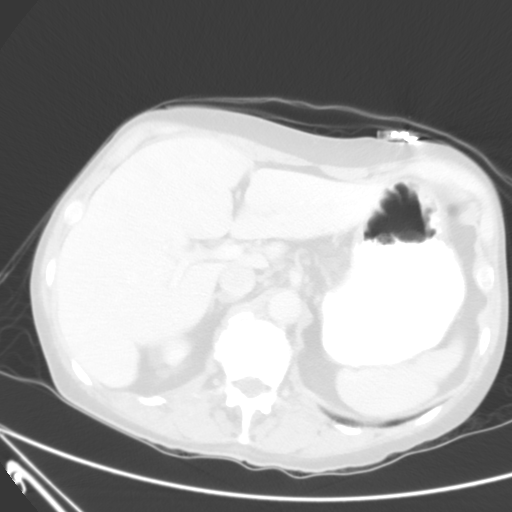
[im 22/25  soft-tissue]
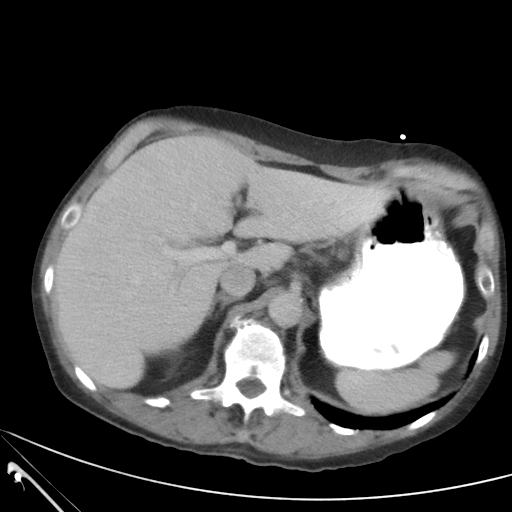
[im 24/25  lung]
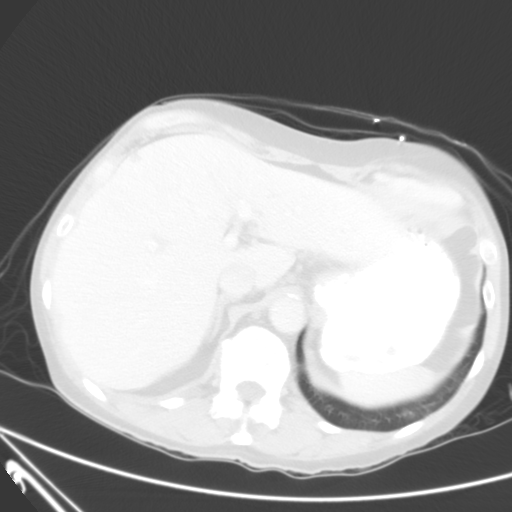

[17 of 25 positions shown; findings below may reference images not displayed]

FINDINGS: CTA CHEST FINDINGS

THORACIC INLET/BODY WALL:

There is adenopathy at the right thoracic inlet measuring 9 mm in
short axis.

MEDIASTINUM:

Marked cardiomegaly, with particular notable left heart enlargement.
No pericardial effusion. Diffuse atherosclerosis, including the
coronary arteries. On chest imaging there is no enhancement within
the left heart or systemic arterial circulation. No evidence of
acute aortic syndrome.

Enlarged main pulmonary artery, with the right main pulmonary artery
measuring 2.4 cm. There is no pulmonary filling defect.

Mild mediastinal lymphadenopathy, most notably in upper right
peritracheal lymph node which measures 14 mm short axis.

LUNG WINDOWS:

There is diffuse bronchial wall thickening with interlobular septal
thickening and patchy ground-glass density. Atelectatic changes
present in the lingula. There is a mass within the superior segment
left lower lobe measuring 19 mm in diameter. Calcification is
present within the mass, although small volume and eccentric. There
is an additional nodule in the left upper lobe on image 30 tube
measuring 8 mm.

OSSEOUS:

No acute fracture.  No suspicious lytic or blastic lesions.

Review of the MIP images confirms the above findings.

CTA ABDOMEN AND PELVIS FINDINGS

BODY WALL: Unremarkable.

Liver: Mosaic appearance of the liver is likely from passive
congestion. There is a 7 mm cyst within the anterior segment right
hepatic lobe. Additional low-density foci in the liver too small to
characterize.

Biliary: No evidence of biliary obstruction or stone.

Pancreas: Unremarkable.

Spleen: Unremarkable.

Adrenals: Unremarkable.

Kidneys and ureters: Early striated appearance of the kidneys which
resolves on delayed imaging. 19 mm cyst in the interpolar left
kidney with dependent calcification, likely layering stone rather
than dystrophic mural calcification.

Bladder: Circumferential thickening of the bladder could be from
chronic outlet obstruction, accentuated by underdistention. No
inflammatory changes or focal thickening/ and enhancement to suggest
urothelial neoplasm.

Reproductive: Unremarkable.

Bowel: No obstruction. Negative appendix.

Retroperitoneum: No mass or adenopathy.

Peritoneum: No ascites or pneumoperitoneum.

Vascular: Diffuse atherosclerotic calcification of the aorta and
branch vessels. No significant stenosis of the major aortic
branches. Aortic branching is typical. There is no aneurysm or
dissection. No inflammatory change around the aortic wall.

OSSEOUS: No acute abnormalities. Diffuse degenerative disc disease
superimposed on a congenitally narrow spinal canal.

Review of the MIP images confirms the above findings.
IMPRESSION: 1. CHF.
2. Negative for pulmonary embolism.
3. 19 mm nodule in the left lower lobe could reflect a neoplasm. PET
CT may be useful in determining need for biopsy.
4. Mild thoracic lymphadenopathy which can be re-evaluated at
follow-up.
5. No acute intra-abdominal findings.
6. Chronic appearing findings are discussed above.

## 2014-10-13 MED ORDER — DIGOXIN 125 MCG PO TABS
0.1250 mg | ORAL_TABLET | Freq: Every day | ORAL | Status: DC
  Administered 2014-10-13 – 2014-10-28 (×16): 0.125 mg via ORAL
  Filled 2014-10-13 (×17): qty 1

## 2014-10-13 MED ORDER — MORPHINE SULFATE 2 MG/ML IJ SOLN
1.0000 mg | INTRAMUSCULAR | Status: DC | PRN
  Administered 2014-10-13: 1 mg via INTRAVENOUS
  Administered 2014-10-13: 2 mg via INTRAVENOUS
  Administered 2014-10-13: 1 mg via INTRAVENOUS
  Administered 2014-10-13 – 2014-10-16 (×9): 2 mg via INTRAVENOUS
  Administered 2014-10-16: 1 mg via INTRAVENOUS
  Administered 2014-10-16: 2 mg via INTRAVENOUS
  Administered 2014-10-17: 1 mg via INTRAVENOUS
  Administered 2014-10-18 – 2014-10-21 (×7): 2 mg via INTRAVENOUS
  Administered 2014-10-23 (×2): 1 mg via INTRAVENOUS
  Administered 2014-10-24 (×3): 2 mg via INTRAVENOUS
  Filled 2014-10-13 (×26): qty 1

## 2014-10-13 MED ORDER — SPIRONOLACTONE 12.5 MG HALF TABLET
12.5000 mg | ORAL_TABLET | Freq: Every day | ORAL | Status: DC
  Administered 2014-10-13 – 2014-10-20 (×8): 12.5 mg via ORAL
  Filled 2014-10-13 (×8): qty 1

## 2014-10-13 MED ORDER — ENSURE COMPLETE PO LIQD
237.0000 mL | Freq: Three times a day (TID) | ORAL | Status: DC
  Administered 2014-10-13 (×2): 237 mL via ORAL

## 2014-10-13 MED ORDER — IOHEXOL 350 MG/ML SOLN
100.0000 mL | Freq: Once | INTRAVENOUS | Status: AC | PRN
Start: 2014-10-13 — End: 2014-10-13
  Administered 2014-10-13: 100 mL via INTRAVENOUS

## 2014-10-13 MED ORDER — MILRINONE IN DEXTROSE 20 MG/100ML IV SOLN
0.2500 ug/kg/min | INTRAVENOUS | Status: DC
  Administered 2014-10-13 – 2014-10-14 (×2): 0.25 ug/kg/min via INTRAVENOUS
  Filled 2014-10-13 (×4): qty 100

## 2014-10-13 MED ORDER — FUROSEMIDE 10 MG/ML IJ SOLN
40.0000 mg | Freq: Once | INTRAMUSCULAR | Status: AC
  Administered 2014-10-13: 40 mg via INTRAVENOUS
  Filled 2014-10-13: qty 4

## 2014-10-13 MED ORDER — SODIUM CHLORIDE 0.9 % IV BOLUS (SEPSIS): 250.0000 mL | Freq: Once | INTRAVENOUS | Status: DC

## 2014-10-13 MED ORDER — SODIUM CHLORIDE 0.9 % IV BOLUS (SEPSIS)
500.0000 mL | Freq: Once | INTRAVENOUS | Status: AC
  Administered 2014-10-13: 500 mL via INTRAVENOUS

## 2014-10-13 MED ORDER — OXYCODONE-ACETAMINOPHEN 5-325 MG PO TABS
1.0000 | ORAL_TABLET | ORAL | Status: DC | PRN
  Filled 2014-10-13: qty 1

## 2014-10-13 MED ORDER — HEPARIN SODIUM (PORCINE) 5000 UNIT/ML IJ SOLN
5000.0000 [IU] | Freq: Three times a day (TID) | INTRAMUSCULAR | Status: DC
  Administered 2014-10-13 – 2014-10-15 (×6): 5000 [IU] via SUBCUTANEOUS
  Filled 2014-10-13 (×9): qty 1

## 2014-10-13 MED ORDER — PHENYLEPHRINE HCL 10 MG/ML IJ SOLN
30.0000 ug/min | INTRAVENOUS | Status: DC
  Administered 2014-10-13: 30 ug/min via INTRAVENOUS
  Filled 2014-10-13: qty 1

## 2014-10-13 MED ORDER — HEPARIN (PORCINE) IN NACL 100-0.45 UNIT/ML-% IJ SOLN
1000.0000 [IU]/h | INTRAMUSCULAR | Status: DC
  Administered 2014-10-13: 1000 [IU]/h via INTRAVENOUS
  Filled 2014-10-13: qty 250

## 2014-10-13 MED ORDER — HEPARIN BOLUS VIA INFUSION
5000.0000 [IU] | Freq: Once | INTRAVENOUS | Status: AC
  Administered 2014-10-13: 5000 [IU] via INTRAVENOUS
  Filled 2014-10-13: qty 5000

## 2014-10-13 MED ORDER — PHENYLEPHRINE HCL 10 MG/ML IJ SOLN
30.0000 ug/min | INTRAVENOUS | Status: DC
  Administered 2014-10-13: 70 ug/min via INTRAVENOUS
  Administered 2014-10-13 – 2014-10-14 (×2): 40 ug/min via INTRAVENOUS
  Filled 2014-10-13 (×3): qty 4

## 2014-10-13 MED ORDER — ACETAMINOPHEN 325 MG PO TABS
650.0000 mg | ORAL_TABLET | Freq: Once | ORAL | Status: AC
  Administered 2014-10-13: 650 mg via ORAL
  Filled 2014-10-13: qty 2

## 2014-10-13 MED ORDER — OXYCODONE HCL 5 MG PO TABS
5.0000 mg | ORAL_TABLET | ORAL | Status: DC | PRN
  Filled 2014-10-13: qty 1

## 2014-10-13 MED ORDER — IPRATROPIUM-ALBUTEROL 0.5-2.5 (3) MG/3ML IN SOLN
3.0000 mL | Freq: Four times a day (QID) | RESPIRATORY_TRACT | Status: DC
  Administered 2014-10-13: 3 mL via RESPIRATORY_TRACT
  Filled 2014-10-13: qty 3

## 2014-10-13 MED ORDER — MORPHINE SULFATE 2 MG/ML IJ SOLN
INTRAMUSCULAR | Status: AC
  Filled 2014-10-13: qty 1

## 2014-10-13 MED ORDER — FUROSEMIDE 10 MG/ML IJ SOLN
40.0000 mg | Freq: Two times a day (BID) | INTRAMUSCULAR | Status: DC
  Administered 2014-10-13 – 2014-10-14 (×2): 40 mg via INTRAVENOUS
  Filled 2014-10-13 (×4): qty 4

## 2014-10-13 MED ORDER — LEVOFLOXACIN IN D5W 750 MG/150ML IV SOLN
750.0000 mg | INTRAVENOUS | Status: DC
  Administered 2014-10-13 – 2014-10-14 (×2): 750 mg via INTRAVENOUS
  Filled 2014-10-13 (×2): qty 150

## 2014-10-13 MED ORDER — MUPIROCIN 2 % EX OINT
1.0000 "application " | TOPICAL_OINTMENT | Freq: Two times a day (BID) | CUTANEOUS | Status: AC
  Administered 2014-10-13 – 2014-10-17 (×10): 1 via NASAL
  Filled 2014-10-13 (×2): qty 22

## 2014-10-13 MED ORDER — IOHEXOL 300 MG/ML  SOLN: 25.0000 mL | Freq: Once | INTRAMUSCULAR | Status: AC | PRN

## 2014-10-13 MED ORDER — HEPARIN SODIUM (PORCINE) 5000 UNIT/ML IJ SOLN
5000.0000 [IU] | Freq: Three times a day (TID) | INTRAMUSCULAR | Status: DC
  Administered 2014-10-13: 5000 [IU] via SUBCUTANEOUS

## 2014-10-13 MED ORDER — CHLORHEXIDINE GLUCONATE CLOTH 2 % EX PADS
6.0000 | MEDICATED_PAD | Freq: Every day | CUTANEOUS | Status: AC
  Administered 2014-10-14 – 2014-10-17 (×4): 6 via TOPICAL

## 2014-10-13 NOTE — Progress Notes (Addendum)
S: Patient was transferred to ICU care last night 10/12/14 due to hypotension and worsening dyspnea.  Will continue to follow peripherally for hopeful return of care at a later date to our service.  A/P:  Mr. Rubenstein is a 63 year old male with a cc of dyspnea/SOB, sternal CP, mildly productive cough, and subjective generalized abdominal pain.  1) Dyspnea/SOB/Cough Likely CHF exacerbation. Initial concern for PE with elevated ddimer and recent car ride from [place].  CTA negative for PE, but did show 69m LLL pulmonary nodule. BP 84/69, RR 19, O2 100% RA. Respiratory alkalosis. Cultures obtained and treating empirically for PNA with levaquin.  2) CHF exacerbation secondary diagnosis.  HR 105, BNP 20,000+, Trops negative x3, crackles heard in bilateral bases.  Echo pending. Central line being placed by CCM for better monitoring. Metabolic acidosis from poor perfusion due to CHF. Pressor support for persistent hypotension. Also receiving lasix for diuresis, limited by BP, pending CVP assessment.  3) Abdominal pain of unknown etiology.  Subjective pain on exam with non-tender palpation of abdomen.  AST/ALT 19.  Alk Phos 113 and LDH positive at 312.  Lipase 15 and Lactic Acid 2.  [place].  CT of A/P negative for acute intra-abdominal pathology.  Appreciate great care by CCM team.   Note started by BRise Mu medical student. MHilton Sinclair MD PGY-3, [place]

## 2014-10-13 NOTE — Progress Notes (Signed)
CRITICAL VALUE ALERT  Critical value received:  CO2 19  Date of notification:  10/15  Time of notification:  0600  Critical value read back:yes  Nurse who received alert:  A.Ezequiel Ganser RN  MD notified (1st page):  elink  Time of first page:  0625  MD notified (2nd page):  Time of second page:  Responding MD:  elink RN Eddie Dibbles to pass along to MD [facility]  Time MD responded:  [phone]

## 2014-10-13 NOTE — Progress Notes (Signed)
63yo male now tx'd to MICU to start IV ABX for possible CAP.  Will start Levaquin 750mg  IV Q24H for CrCl ~43ml/min and monitor CBC and Cx.  Wynona Neat, PharmD, BCPS 10/13/2014 5:44 AM

## 2014-10-13 NOTE — Progress Notes (Addendum)
Spoke with Dr. Rosita Fire, ICU, and discussed patient's recent decline and history since admission. Patient now with hypotension, tachycardia, increased dyspnea (on room air)  and uncontrolled pain. D-Dimer ~15. Recent travel by car from [place] to [place].  BP 78/50  Pulse 106  Temp(Src) 97.6 F (36.4 C) (Oral)  Resp 18  Ht 5\' 11"  (1.803 m)  Wt 128 lb 4.9 oz (58.2 kg)  BMI 17.90 kg/m2  SpO2 98%  - per review of chart we have decided to obtain STAT CTA of chest and CT abd. Start Hep drip. - IVF bolus 500cc - Detailed discussion about code status with patient was performed d/t this current decline and possible PE. On Admission he stated he wanted DNR but has since changed his mind. He will be placed as full code.  - Pt transferred to ICU  [place], [place] PGY-3

## 2014-10-13 NOTE — H&P (Signed)
FMTS Attending Admission Note: Leroy Sabal MD Personal pager:  [phone] FPTS Service Pager:  [phone]  I  have seen and examined this patient, reviewed their chart. I have discussed this patient with the resident. I agree with the resident's findings, assessment and care plan.  Additionally:  See my separate admit note for details.

## 2014-10-13 NOTE — Progress Notes (Signed)
INITIAL NUTRITION ASSESSMENT  DOCUMENTATION CODES Per approved criteria  -Severe malnutrition in the context of chronic illness   Pt meets criteria for severe MALNUTRITION in the context of chronic illness as evidenced by severe depletion of muscle mass and 20% weight loss over the past 3 months.  INTERVENTION:  Ensure Complete PO TID, each supplement provides 350 kcal and 13 grams of protein  NUTRITION DIAGNOSIS: Malnutrition related to inadequate oral intake as evidenced by severe depletion of muscle mass and 20% weight loss over the past 3 months.   Goal: Intake to meet >90% of estimated nutrition needs.  Monitor:  PO intake, labs, weight trend.  Reason for Assessment: MST  63 y.o. male  Admitting Dx: SOB  ASSESSMENT: 63 year old male presented to ED 10/14 c/o SOB, CP. He was admitted for presumed acute on chronic CHF and treated with diuresis. 10/15 early AM he became acutely more dyspneic and hypotensive with CP. He was transferred to ICU.  Patient reports recent poor PO intake related to poor appetite and limited access to food. When he was in [place], he had limited transportation to get food and he was never really hungry anyway. Usual weight is 165 lb 3 months ago. Recently moved to [facility]. He currently has a poor appetite. Agreed to try Ensure Complete between meals to maximize oral intake.  Nutrition Focused Physical Exam:  Subcutaneous Fat:  Orbital Region: WNL Upper Arm Region: moderate depletion Thoracic and Lumbar Region: NA  Muscle:  Temple Region: mild depletion Clavicle Bone Region: mild depletion Clavicle and Acromion Bone Region: mild depletion Scapular Bone Region: NA Dorsal Hand: mild depletion Patellar Region: moderate depletion Anterior Thigh Region: severe depletion Posterior Calf Region: severe depletion  Edema: none   Height: Ht Readings from Last 1 Encounters:  10/12/14 5\' 11"  (1.803 m)    Weight: Wt Readings from Last 1 Encounters:   10/13/14 132 lb 11.5 oz (60.2 kg)    Ideal Body Weight: 78.2 kg  % Ideal Body Weight: 77%  Wt Readings from Last 10 Encounters:  10/13/14 132 lb 11.5 oz (60.2 kg)    Usual Body Weight: 165 lb  % Usual Body Weight: 80%  BMI:  Body mass index is 18.52 kg/(m^2).  Estimated Nutritional Needs: Kcal: 2000-2200 Protein: 100-110 gm Fluid: 2-2.2 L  Skin: intact  Diet Order: Cardiac  EDUCATION NEEDS: -Education needs addressed   Intake/Output Summary (Last 24 hours) at 10/13/14 1044 Last data filed at 10/13/14 1004  Gross per 24 hour  Intake 980.92 ml  Output   1640 ml  Net -659.08 ml    Last BM: 10/14   Labs:   Recent Labs Lab 10/12/14 1316 10/13/14 0049  NA 139 137  K 4.6 4.5  CL 103 100  CO2 20 21  BUN 26* 28*  CREATININE 0.97 1.17  CALCIUM 9.1 9.2  GLUCOSE 79 112*    CBG (last 3)   Recent Labs  10/13/14 0432  GLUCAP 90    Scheduled Meds: . Chlorhexidine Gluconate Cloth  6 each Topical Q0600  . heparin subcutaneous  5,000 Units Subcutaneous 3 times per day  . ipratropium-albuterol  3 mL Nebulization Q6H  . levofloxacin (LEVAQUIN) IV  750 mg Intravenous Q24H  . mupirocin ointment  1 application Nasal BID  . pantoprazole  40 mg Oral Daily  . simvastatin  40 mg Oral Daily    Continuous Infusions: . phenylephrine (NEO-SYNEPHRINE) Adult infusion 40 mcg/min (10/13/14 1022)    Past Medical History  Diagnosis  Date  . COPD (chronic obstructive pulmonary disease)   . Hypertension   . High cholesterol   . Chronic bronchitis   . GERD (gastroesophageal reflux disease)   . History of blood transfusion 2010    "had a punctured lung that was full of bile"  . Stroke     "I've had a couple small strokes this year; left me a little weak in RLE"  . Arthritis     "all over"  . Bulging lumbar disc   . CHF (congestive heart failure) dx'd 03/2012    Past Surgical History  Procedure Laterality Date  . Cardiac catheterization  2013  . Tonsillectomy     . Colonoscopy  04/2014    Molli Barrows, [place], [place], [place] Pager [phone] After Hours Pager [phone]

## 2014-10-13 NOTE — Procedures (Signed)
Central Venous Catheter Insertion Procedure Note Leroy Kennedy [id number] 15-Aug-1951  Procedure: Insertion of Central Venous Catheter Indications: Assessment of intravascular volume, Drug and/or fluid administration and Frequent blood sampling  Procedure Details Consent: Risks of procedure as well as the alternatives and risks of each were explained to the (patient/caregiver).  Consent for procedure obtained. Time Out: Verified patient identification, verified procedure, site/side was marked, verified correct patient position, special equipment/implants available, medications/allergies/relevent history reviewed, required imaging and test results available.  Performed  Maximum sterile technique was used including antiseptics, cap, gloves, gown, hand hygiene, mask and sheet. Skin prep: Chlorhexidine; local anesthetic administered A antimicrobial bonded/coated triple lumen catheter was placed in the left internal jugular vein using the Seldinger technique.  Evaluation Blood flow good Complications: No apparent complications Patient did tolerate procedure well. Chest X-ray ordered to verify placement.  CXR: pending.  Procedure performed under direct ultrasound guidance for real time vessel cannulation.      [name], [place] [facility] Pgr: [phone]  or [phone] 10/13/2014, 11:32 AM   [place] Hep off [name]. [name], MD, [name] [facility]

## 2014-10-13 NOTE — Progress Notes (Signed)
ABG    Component Value Date/Time   PHART 7.517* 10/13/2014 0602   PCO2ART 19.0* 10/13/2014 0602   PO2ART 128.0* 10/13/2014 0602   HCO3 15.3* 10/13/2014 0602   TCO2 15.9 10/13/2014 0602   ACIDBASEDEF 7.2* 10/13/2014 0602   O2SAT 99.8 10/13/2014 0602   On 2lpm [place]. Called to report critical values to RN, and reported that the patient's respiratory effort had increased. Had brought in BiPAP per order, and patient stated that he thought he couldn't wear it;  MD agreed and stated to hold on off on BIPAP for now.

## 2014-10-13 NOTE — Progress Notes (Signed)
CSW order received on 10/12/14 to assist patient with the following order:  Reason for Consult: "recently moved here; need dr to write for my Hydrocodone; no PCP here.  This is not a function of CSW services and will ask RNCM to follow up with patient re: the above issues.  CSW services will sign off but will be available if needed in the future for re-consult if needed.  Lorie Phenix. Pauline Good, [place]

## 2014-10-13 NOTE — Progress Notes (Signed)
Seen and examined by Dr. Brita Romp.

## 2014-10-13 NOTE — Progress Notes (Signed)
ANTICOAGULATION CONSULT NOTE - Initial Consult  Pharmacy Consult for heparin Indication: pulmonary embolus  No Known Allergies  Patient Measurements: Height: 5\' 11"  (180.3 cm) Weight: 128 lb 4.9 oz (58.2 kg) IBW/kg (Calculated) : 75.3  Vital Signs: Temp: 97.6 F (36.4 C) (10/15 0230) Temp Source: Oral (10/15 0230) BP: 78/50 mmHg (10/15 0239) Pulse Rate: 106 (10/15 0230)  Labs:  Recent Labs  10/12/14 1316 10/12/14 1958 10/13/14 0049  HGB 10.4*  --  11.3*  HCT 33.1*  --  35.0*  PLT 294  --  362  CREATININE 0.97  --  1.17  TROPONINI  --  <0.30 <0.30    Estimated Creatinine Clearance: 53.2 ml/min (by C-G formula based on Cr of 1.17).   Medical History: Past Medical History  Diagnosis Date  . COPD (chronic obstructive pulmonary disease)   . Hypertension   . High cholesterol   . Chronic bronchitis   . GERD (gastroesophageal reflux disease)   . History of blood transfusion 2010    "had a punctured lung that was full of bile"  . Stroke     "I've had a couple small strokes this year; left me a little weak in RLE"  . Arthritis     "all over"  . Bulging lumbar disc   . CHF (congestive heart failure) dx'd 03/2012    Medications:  Prescriptions prior to admission  Medication Sig Dispense Refill  . carvedilol (COREG) 3.125 MG tablet Take 3.125 mg by mouth 2 (two) times daily with a meal.      . furosemide (LASIX) 40 MG tablet Take 40 mg by mouth 2 (two) times daily.      Marland Kitchen HYDROcodone-acetaminophen (NORCO) 10-325 MG per tablet Take 1 tablet by mouth every 6 (six) hours as needed (for pain).      Marland Kitchen ibuprofen (ADVIL,MOTRIN) 400 MG tablet Take 400 mg by mouth every 8 (eight) hours as needed (for pain).      Marland Kitchen lisinopril (PRINIVIL,ZESTRIL) 2.5 MG tablet Take 2.5 mg by mouth daily.      Marland Kitchen omeprazole (PRILOSEC) 20 MG capsule Take 20 mg by mouth daily.      . potassium chloride SA (K-DUR,KLOR-CON) 20 MEQ tablet Take 20 mEq by mouth 2 (two) times daily.      . simvastatin  (ZOCOR) 40 MG tablet Take 40 mg by mouth daily.       Scheduled:  . Influenza vac split quadrivalent PF  0.5 mL Intramuscular Tomorrow-1000  . mometasone-formoterol  2 puff Inhalation BID  . nicotine  7 mg Transdermal Daily  . pantoprazole  40 mg Oral Daily  . potassium chloride SA  20 mEq Oral BID  . simvastatin  40 mg Oral Daily  . sodium chloride  250 mL Intravenous Once    Assessment: 63yo male c/o SOB and slight CP, admitted for CHF exacerbation, after admission pt c/o acute CP, D-dimer found to be elevated, CT pending, to begin heparin for suspected PE.  Goal of Therapy:  Heparin level 0.3-0.7 units/ml Monitor platelets by anticoagulation protocol: Yes   Plan:  Will give heparin 5000 units IV bolus (per request from Dr Alva Garnet of [place]) followed by gtt at 1000 units/hr and monitor heparin levels and CBC.  Wynona Neat, PharmD, BCPS  10/13/2014,2:59 AM

## 2014-10-13 NOTE — Progress Notes (Addendum)
*  Preliminary Results* Bilateral lower extremity venous duplex completed. Bilateral lower extremities are negative for deep vein thrombosis. There is no evidence of Baker's cyst bilaterally. Spectral doppler of lower extremity veins exhibits pulsatile flow, suggestive of elevated right sided heart pressure.  10/13/2014  Maudry Mayhew, RVT, RDCS, RDMS

## 2014-10-13 NOTE — Consult Note (Addendum)
PULMONARY / CRITICAL CARE MEDICINE   Name: Leroy Kennedy MRN: [id number] DOB: 11/21/1951    ADMISSION DATE:  10/12/2014 CONSULTATION DATE:  10/13/2014  REFERRING MD :  Dr Mingo Amber  CHIEF COMPLAINT:  SOB  INITIAL PRESENTATION: 63 year old male presented to ED 10/14 c/o SOB, CP. He was admitted for presumed [place] CHF and treated with diuresis. 10/15 early AM he became acutely more dyspneic and hypotensive with CP. D-dimer was ordered and came back positive. He was taken for CTA chest and transferred to ICU. PCCM asked to take over care.    STUDIES:  CTA chest 10/15  >>>   SIGNIFICANT EVENTS: 10/14 admitted for CHF exacerbation 10/15 transferred to ICU with CP, SOB, distress  HISTORY OF PRESENT ILLNESS:  62 year old male with PMH as below, which includes COPD, HTN, CVA, and CHF, presented to ED 10/14 c/o SOB and chest pain. He recently moved from [place] to [place] and made the trip by car. Patient states he has had these symptoms for 2 years, worse for the past 6 days and notes an unintentional 30 pound weight loss in the last 3 months. He also complains of increased cough, which has been productive. Chest pain is dull and lingering and is exacerbated by coughing, deep breathing. Has also been having palpitations. Also c/o severe RUQ and lower abdominal pain. He was admitted for presumed CHF and treated with diuresis. 10/15 early AM the patient developed worsening tachycardia, hypotension, and dyspnea. He was started on heparin infusion and STAT CTA chest. He was then transferred to ICU for further eval. PCCM to see.   PAST MEDICAL HISTORY :   has a past medical history of COPD (chronic obstructive pulmonary disease); Hypertension; High cholesterol; Chronic bronchitis; GERD (gastroesophageal reflux disease); History of blood transfusion (2010); Stroke; Arthritis; Bulging lumbar disc; and CHF (congestive heart failure) (dx'd 03/2012).  has past surgical history that includes Cardiac catheterization (2013);  Tonsillectomy; and Colonoscopy (04/2014). Prior to Admission medications   Medication Sig Start Date End Date Taking? Authorizing Provider  carvedilol (COREG) 3.125 MG tablet Take 3.125 mg by mouth 2 (two) times daily with a meal.   Yes Historical Provider, MD  furosemide (LASIX) 40 MG tablet Take 40 mg by mouth 2 (two) times daily.   Yes Historical Provider, MD  HYDROcodone-acetaminophen (NORCO) 10-325 MG per tablet Take 1 tablet by mouth every 6 (six) hours as needed (for pain).   Yes Historical Provider, MD  ibuprofen (ADVIL,MOTRIN) 400 MG tablet Take 400 mg by mouth every 8 (eight) hours as needed (for pain).   Yes Historical Provider, MD  lisinopril (PRINIVIL,ZESTRIL) 2.5 MG tablet Take 2.5 mg by mouth daily.   Yes Historical Provider, MD  omeprazole (PRILOSEC) 20 MG capsule Take 20 mg by mouth daily.   Yes Historical Provider, MD  potassium chloride SA (K-DUR,KLOR-CON) 20 MEQ tablet Take 20 mEq by mouth 2 (two) times daily.   Yes Historical Provider, MD  simvastatin (ZOCOR) 40 MG tablet Take 40 mg by mouth daily.   Yes Historical Provider, MD   No Known Allergies  FAMILY HISTORY:  has no family status information on file.  SOCIAL HISTORY:  reports that he has been smoking Cigarettes.  He has a 23 pack-year smoking history. He has never used smokeless tobacco. He reports that he drinks about 4.8 ounces of alcohol per week. He reports that he does not use illicit drugs.  REVIEW OF SYSTEMS:   Bolds are positive  Constitutional: weight loss, gain, night  sweats, Fevers, chills, fatigue .  HEENT: headaches, Sore throat, sneezing, nasal congestion, post nasal drip, Difficulty swallowing, Tooth/dental problems, visual complaints visual changes, ear ache CV:  chest pain, radiates to back and arm, worse with cough and breathing: ,Orthopnea, PND, swelling in lower extremities, dizziness, palpitations, syncope.  GI  heartburn, indigestion, abdominal pain, nausea, vomiting, diarrhea, change in bowel  habits, loss of appetite, bloody stools.  Resp: cough, productive: green sputum , hemoptysis, dyspnea, chest pain, pleuritic.  Skin: rash or itching or icterus GU: dysuria, change in color of urine, urgency or frequency. flank pain, hematuria  MS: joint pain or swelling. decreased range of motion  Psych: change in mood or affect. depression or anxiety Neuro: difficulty with speech, weakness, numbness, ataxia   SUBJECTIVE:   VITAL SIGNS: Temp:  [97.6 F (36.4 C)-98 F (36.7 C)] 97.6 F (36.4 C) (10/15 0230) Pulse Rate:  [89-121] 106 (10/15 0230) Resp:  [17-27] 18 (10/15 0230) BP: (78-120)/(50-84) 78/50 mmHg (10/15 0239) SpO2:  [98 %-100 %] 98 % (10/15 0230) Weight:  [58.2 kg (128 lb 4.9 oz)-60.895 kg (134 lb 4 oz)] 58.2 kg (128 lb 4.9 oz) (10/14 1820) HEMODYNAMICS:   VENTILATOR SETTINGS:   INTAKE / OUTPUT:  Intake/Output Summary (Last 24 hours) at 10/13/14 0420 Last data filed at 10/13/14 0236  Gross per 24 hour  Intake      0 ml  Output   1500 ml  Net  -1500 ml    PHYSICAL EXAMINATION: General:  Thin male in NAD Neuro:  Alert, oriented, no focal deficit HEENT:  [place]/AT, PERRL, No JVD noted Cardiovascular:  Tachy, regular, no MRG Lungs:  Respirations even, unlabored, diminished bibasilar breath sounds  Abdomen:  Soft, diffusely tender. BS normoactive Musculoskeletal:  NO acute deformity, no edema Skin:  Intact  LABS:  CBC  Recent Labs Lab 10/12/14 1316 10/13/14 0049  WBC 9.9 9.3  HGB 10.4* 11.3*  HCT 33.1* 35.0*  PLT 294 362   Coag's No results found for this basename: APTT, INR,  in the last 168 hours BMET  Recent Labs Lab 10/12/14 1316 10/13/14 0049  NA 139 137  K 4.6 4.5  CL 103 100  CO2 20 21  BUN 26* 28*  CREATININE 0.97 1.17  GLUCOSE 79 112*   Electrolytes  Recent Labs Lab 10/12/14 1316 10/13/14 0049  CALCIUM 9.1 9.2   Sepsis Markers  Recent Labs Lab 10/12/14 1958  LATICACIDVEN 2.0   ABG No results found for this basename:  PHART, PCO2ART, PO2ART,  in the last 168 hours Liver Enzymes  Recent Labs Lab 10/12/14 1958  AST 19  ALT 19  ALKPHOS 113  BILITOT 0.4  ALBUMIN 3.1*   Cardiac Enzymes  Recent Labs Lab 10/12/14 1316 10/12/14 1958 10/13/14 0049  TROPONINI  --  <0.30 <0.30  PROBNP 20243.0*  --   --    Glucose No results found for this basename: GLUCAP,  in the last 168 hours  Imaging Dg Chest 2 View  10/12/2014   CLINICAL DATA:  Short of breath  EXAM: CHEST  2 VIEW  COMPARISON:  None.  FINDINGS: Cardiac enlargement without heart failure. Mild COPD with hyperinflation of the lungs and mild scarring in the bases. Negative for pneumonia or effusion.  IMPRESSION: No active cardiopulmonary disease.   Electronically Signed   By: Franchot Gallo M.D.   On: 10/12/2014 15:02     ASSESSMENT / PLAN:  PULMONARY A: Acute resp failure CHF exacerbation  H/o COPD  P:  Supplemental O2 to maintain SpO2 greater than 92%. Repeat CXR F/u CTA chest Continue scheduled BD's Cough suppression Lasix to neg balance as BP tolerates  BIPAP wold be beneficial, consider 4 hr on 2 hr off x 24 hr  CARDIOVASCULAR CVL A:  Shock, cardiogenic vs obstructive Acute on chronic CHF H/o HTN  P:  MAP goal 55 mm/Hg Phenylephrine, titrate to achieve MAP goal CVP if CVL inserted, assess svo2 as well Check and trend lactic Repeat ECG Serial troponin neg , dc further F/u Echo Hold home antihypertensives (coreg, lasix, lisinopril) chf failure service If svo2 less 65-70, cnsider addition milrinone Await cvpfor lasix for now  RENAL A:   No acute issues  P:   Follow Bmet Hold K supplementation while holding lasix Lasix likely to start after cvp, svo2 assessment  GASTROINTESTINAL A:   H/o GERD  P:   Continue preadmission PPI NPO for now with resp distress   HEMATOLOGIC A:   Possible PE/DVT, D-dimer elevated Anemia  P:  Doppler neg Continue heparin pending CT/doppler results - dc heparin , all neg,  add sub q heparin Follow CBC am  INFECTIOUS A:   R/o CAP vs bronchitis Unimpressive CT P:   BCx2 10/14 >>> Sputum 10/14 >>> Abx: Levaquin 10/15 >>> Trend PCT to limit abx Trend WBC and fever curve  ENDOCRINE A:   R/o rel AI  P:   Monitor Cortisol then stress roids if less 20 Consider stress dose steroids  NEUROLOGIC A:   Distres, NIMV  P:   RASS goal: 0 Monitor, may need fent  Family updated: no family 10/15   Interdisciplinary Family Meeting v Palliative Care Meeting:    Georgann Housekeeper, ACNP [facility] Pager [phone] or [phone]  TODAY'S SUMMARY:   I have personally obtained a history, examined the patient, evaluated laboratory and imaging results, formulated the assessment and plan and placed orders. CRITICAL CARE: The patient is critically ill with multiple organ systems failure and requires high complexity decision making for assessment and support, frequent evaluation and titration of therapies, application of advanced monitoring technologies and extensive interpretation of multiple databases. Critical Care Time devoted to patient care services described in this note is 30 minutes this time is my personal time spent.   [name]. [name], MD, [place] Pgr: [facility]

## 2014-10-13 NOTE — Consult Note (Addendum)
Advanced Heart Failure Team History and Physical Note   Primary Physician: N/A Primary Cardiologist:    Reason for Admission: A/C HF   HPI:    Asked for consult by PCCM.  Leroy Kennedy is a 63 yo male with a history of chronic systolic HF, COPD, back pain d/t slipped disc and tobacco abuse.  Underwent cardiac cath in 2013 in [place] after EF found to be 35%. Says cath showed normal arteries.   Moved from [place] to [place] about a week ago. He has had increased SOB, dyspnea, palpitations, severe RUQ abdominal pain and fatigue for over a week. Of note he reports he has lost over 40 lbs in the past 6 months. Had fevers/chills about 2 weeks ago. He reports taking medications as prescribed. Pertinent labs on admission were pro-BNP 20243, creatinine 0.97, Hgb 10.4 and troponin <0.30. Admitted for IV diuresis.  CT Chest: negative for PE, 19 mm nodule in LLL Bilateral LE doppler: no DVT  Central line placed (10/15). CVP 12  I did bedside echo EF 15% Inferior and inferolateral walls are HK all other walls AK. Mild MR. Severe RV dysfunction.   SH: smokes 1/2 ppd, drinks 1-2 6 pack every 10 days. Moved to [place].   Review of Systems: [y] = yes, [ ]  = no   General: Weight gain [ ] ; Weight loss [Y ]; Anorexia [ ] ; Fatigue [ Y]; Fever [Y ]; Chills [Y ]; Weakness Leroy Kennedy ]  Cardiac: Chest pain/pressure [ Y]; Resting SOB [ Y]; Exertional SOB [Y ]; Orthopnea [Y ]; Pedal Edema Aqua.Slicker ]; Palpitations [Y ]; Syncope [ ] ; Presyncope [ ] ; Paroxysmal nocturnal dyspnea[ ]   Pulmonary: Cough [ ] ; Wheezing[ ] ; Hemoptysis[ ] ; Sputum [ ] ; Snoring [ ]   GI: Vomiting[ ] ; Dysphagia[ ] ; Melena[ ] ; Hematochezia [ ] ; Heartburn[ ] ; Abdominal pain [ ] ; Constipation [ ] ; Diarrhea [ ] ; BRBPR [ ]   GU: Hematuria[ ] ; Dysuria [ ] ; Nocturia[ ]   Vascular: Pain in legs with walking [ ] ; Pain in feet with lying flat [ ] ; Non-healing sores [ ] ; Stroke [ ] ; TIA [ ] ; Slurred speech [ ] ;  Neuro: Headaches[ ] ; Vertigo[ ] ; Seizures[ ] ;  Paresthesias[ ] ;Blurred vision [ ] ; Diplopia [ ] ; Vision changes [ ]   Ortho/Skin: Arthritis [ ] ; Joint pain [Y ]; Muscle pain [ ] ; Joint swelling [ ] ; Back Pain [ ] ; Rash [ ]   Psych: Depression[ Y]; Anxiety[ ]   Heme: Bleeding problems [ ] ; Clotting disorders [ ] ; Anemia [ ]   Endocrine: Diabetes [ ] ; Thyroid dysfunction[ ]   Home Medications Prior to Admission medications   Medication Sig Start Date End Date Taking? Authorizing Provider  carvedilol (COREG) 3.125 MG tablet Take 3.125 mg by mouth 2 (two) times daily with a meal.   Yes Historical Provider, MD  furosemide (LASIX) 40 MG tablet Take 40 mg by mouth 2 (two) times daily.   Yes Historical Provider, MD  HYDROcodone-acetaminophen (NORCO) 10-325 MG per tablet Take 1 tablet by mouth every 6 (six) hours as needed (for pain).   Yes Historical Provider, MD  ibuprofen (ADVIL,MOTRIN) 400 MG tablet Take 400 mg by mouth every 8 (eight) hours as needed (for pain).   Yes Historical Provider, MD  lisinopril (PRINIVIL,ZESTRIL) 2.5 MG tablet Take 2.5 mg by mouth daily.   Yes Historical Provider, MD  omeprazole (PRILOSEC) 20 MG capsule Take 20 mg by mouth daily.   Yes Historical Provider, MD  potassium chloride SA (K-DUR,KLOR-CON) 20 MEQ tablet Take 20 mEq by mouth 2 (  two) times daily.   Yes Historical Provider, MD  simvastatin (ZOCOR) 40 MG tablet Take 40 mg by mouth daily.   Yes Historical Provider, MD    Past Medical History: Past Medical History  Diagnosis Date  . COPD (chronic obstructive pulmonary disease)   . Hypertension   . High cholesterol   . Chronic bronchitis   . GERD (gastroesophageal reflux disease)   . History of blood transfusion 2010    "had a punctured lung that was full of bile"  . Stroke     "I've had a couple small strokes this year; left me a little weak in RLE"  . Arthritis     "all over"  . Bulging lumbar disc   . CHF (congestive heart failure) dx'd 03/2012    Past Surgical History: Past Surgical History  Procedure  Laterality Date  . Cardiac catheterization  2013  . Tonsillectomy    . Colonoscopy  04/2014    Family History: History reviewed. No pertinent family history.  Social History: History   Social History  . Marital Status: Single    Spouse Name: N/A    Number of Children: N/A  . Years of Education: N/A   Social History Main Topics  . Smoking status: Current Every Day Smoker -- 0.50 packs/day for 46 years    Types: Cigarettes  . Smokeless tobacco: Never Used  . Alcohol Use: 4.8 oz/week    8 Cans of beer per week  . Drug Use: No  . Sexual Activity: Not Currently   Other Topics Concern  . None   Social History Narrative  . None    Allergies:  No Known Allergies  Objective:    Vital Signs:   Temp:  [97.6 F (36.4 C)-98 F (36.7 C)] 97.6 F (36.4 C) (10/15 0808) Pulse Rate:  [89-121] 99 (10/15 1000) Resp:  [17-27] 25 (10/15 1000) BP: (70-120)/(50-84) 93/72 mmHg (10/15 1000) SpO2:  [98 %-100 %] 100 % (10/15 1000) Weight:  [128 lb 4.9 oz (58.2 kg)-134 lb 4 oz (60.895 kg)] 132 lb 11.5 oz (60.2 kg) (10/15 0400) Last BM Date: 10/12/14 Filed Weights   10/12/14 1315 10/12/14 1820 10/13/14 0400  Weight: 134 lb 4 oz (60.895 kg) 128 lb 4.9 oz (58.2 kg) 132 lb 11.5 oz (60.2 kg)    Physical Exam: General: Chronically appearing. dyspneic with conversation HEENT: normal except for poor dentition and temporal wasting Neck: supple. JVP jaw . Carotids 2+ bilat; no bruits. No lymphadenopathy or thryomegaly appreciated. Cor: PMI laterally displaced. Tachy. +s3. 2/6 MR Lungs: clear, diminished in the bases Abdomen: soft, nontender, nondistended. No hepatosplenomegaly. No bruits or masses. Good bowel sounds. Extremities: no cyanosis, clubbing, rash, edema Neuro: alert & orientedx3, cranial nerves grossly intact. moves all 4 extremities w/o difficulty. Affect pleasant  Telemetry: ST 100-110s  Labs: Basic Metabolic Panel:  Recent Labs Lab 10/12/14 1316 10/13/14 0049  NA 139  137  K 4.6 4.5  CL 103 100  CO2 20 21  GLUCOSE 79 112*  BUN 26* 28*  CREATININE 0.97 1.17  CALCIUM 9.1 9.2    Liver Function Tests:  Recent Labs Lab 10/12/14 1958  AST 19  ALT 19  ALKPHOS 113  BILITOT 0.4  PROT 7.4  ALBUMIN 3.1*    Recent Labs Lab 10/12/14 1958  LIPASE 15   No results found for this basename: AMMONIA,  in the last 168 hours  CBC:  Recent Labs Lab 10/12/14 1316 10/13/14 0049  WBC 9.9 9.3  HGB 10.4*  11.3*  HCT 33.1* 35.0*  MCV 73.9* 73.1*  PLT 294 362    Cardiac Enzymes:  Recent Labs Lab 10/12/14 1958 10/13/14 0049  TROPONINI <0.30 <0.30    BNP: BNP (last 3 results)  Recent Labs  10/12/14 1316  PROBNP 20243.0*    CBG:  Recent Labs Lab 10/13/14 0432  GLUCAP 90    Coagulation Studies:  Recent Labs  10/13/14 0730  LABPROT 17.0*  INR 1.37    Other results: EKG: ST, LBBB QRS 142 msec  Imaging: Dg Chest 2 View  10/12/2014   CLINICAL DATA:  Short of breath  EXAM: CHEST  2 VIEW  COMPARISON:  None.  FINDINGS: Cardiac enlargement without heart failure. Mild COPD with hyperinflation of the lungs and mild scarring in the bases. Negative for pneumonia or effusion.  IMPRESSION: No active cardiopulmonary disease.   Electronically Signed   By: Franchot Gallo M.D.   On: 10/12/2014 15:02   Ct Angio Chest Pe W/cm &/or Wo Cm  10/13/2014   CLINICAL DATA:  Tachycardia and calf tenderness. Shortness of breath and tachycardia. Diffuse abdominal pain. Initial encounter.  EXAM: CT ANGIOGRAPHY CHEST, ABDOMEN AND PELVIS  TECHNIQUE: Multidetector CT imaging through the chest, abdomen and pelvis was performed using the standard protocol during bolus administration of intravenous contrast. Multiplanar reconstructed images and MIPs were obtained and reviewed to evaluate the vascular anatomy.  CONTRAST:  142mL OMNIPAQUE IOHEXOL 350 MG/ML SOLN  COMPARISON:  None.  FINDINGS: CTA CHEST FINDINGS  THORACIC INLET/BODY WALL:  There is adenopathy at the  right thoracic inlet measuring 9 mm in short axis.  MEDIASTINUM:  Marked cardiomegaly, with particular notable left heart enlargement. No pericardial effusion. Diffuse atherosclerosis, including the coronary arteries. On chest imaging there is no enhancement within the left heart or systemic arterial circulation. No evidence of acute aortic syndrome.  Enlarged main pulmonary artery, with the right main pulmonary artery measuring 2.4 cm. There is no pulmonary filling defect.  Mild mediastinal lymphadenopathy, most notably in upper right peritracheal lymph node which measures 14 mm short axis.  LUNG WINDOWS:  There is diffuse bronchial wall thickening with interlobular septal thickening and patchy ground-glass density. Atelectatic changes present in the lingula. There is a mass within the superior segment left lower lobe measuring 19 mm in diameter. Calcification is present within the mass, although small volume and eccentric. There is an additional nodule in the left upper lobe on image 30 tube measuring 8 mm.  OSSEOUS:  No acute fracture.  No suspicious lytic or blastic lesions.  Review of the MIP images confirms the above findings.  CTA ABDOMEN AND PELVIS FINDINGS  BODY WALL: Unremarkable.  Liver: Mosaic appearance of the liver is likely from passive congestion. There is a 7 mm cyst within the anterior segment right hepatic lobe. Additional low-density foci in the liver too small to characterize.  Biliary: No evidence of biliary obstruction or stone.  Pancreas: Unremarkable.  Spleen: Unremarkable.  Adrenals: Unremarkable.  Kidneys and ureters: Early striated appearance of the kidneys which resolves on delayed imaging. 19 mm cyst in the interpolar left kidney with dependent calcification, likely layering stone rather than dystrophic mural calcification.  Bladder: Circumferential thickening of the bladder could be from chronic outlet obstruction, accentuated by underdistention. No inflammatory changes or focal  thickening/ and enhancement to suggest urothelial neoplasm.  Reproductive: Unremarkable.  Bowel: No obstruction. Negative appendix.  Retroperitoneum: No mass or adenopathy.  Peritoneum: No ascites or pneumoperitoneum.  Vascular: Diffuse atherosclerotic calcification of the aorta  and branch vessels. No significant stenosis of the major aortic branches. Aortic branching is typical. There is no aneurysm or dissection. No inflammatory change around the aortic wall.  OSSEOUS: No acute abnormalities. 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  Review of the MIP images confirms the above findings.  IMPRESSION: 1. CHF. 2. Negative for pulmonary embolism. 3. 19 mm nodule in the left lower lobe could reflect a neoplasm. PET CT may be useful in determining need for biopsy. 4. Mild thoracic lymphadenopathy which can be re-evaluated at follow-up. 5. No acute intra-abdominal findings. 6. Chronic appearing findings are discussed above.   Electronically Signed   By: Jorje Guild M.D.   On: 10/13/2014 05:30   Ct Abdomen Pelvis W Contrast  10/13/2014   CLINICAL DATA:  Tachycardia and calf tenderness. Shortness of breath and tachycardia. Diffuse abdominal pain. Initial encounter.  EXAM: CT ANGIOGRAPHY CHEST, ABDOMEN AND PELVIS  TECHNIQUE: Multidetector CT imaging through the chest, abdomen and pelvis was performed using the standard protocol during bolus administration of intravenous contrast. Multiplanar reconstructed images and MIPs were obtained and reviewed to evaluate the vascular anatomy.  CONTRAST:  176mL OMNIPAQUE IOHEXOL 350 MG/ML SOLN  COMPARISON:  None.  FINDINGS: CTA CHEST FINDINGS  THORACIC INLET/BODY WALL:  There is adenopathy at the right thoracic inlet measuring 9 mm in short axis.  MEDIASTINUM:  Marked cardiomegaly, with particular notable left heart enlargement. No pericardial effusion. Diffuse atherosclerosis, including the coronary arteries. On chest imaging there  is no enhancement within the left heart or systemic arterial circulation. No evidence of acute aortic syndrome.  Enlarged main pulmonary artery, with the right main pulmonary artery measuring 2.4 cm. There is no pulmonary filling defect.  Mild mediastinal lymphadenopathy, most notably in upper right peritracheal lymph node which measures 14 mm short axis.  LUNG WINDOWS:  There is diffuse bronchial wall thickening with interlobular septal thickening and patchy ground-glass density. Atelectatic changes present in the lingula. There is a mass within the superior segment left lower lobe measuring 19 mm in diameter. Calcification is present within the mass, although small volume and eccentric. There is an additional nodule in the left upper lobe on image 30 tube measuring 8 mm.  OSSEOUS:  No acute fracture.  No suspicious lytic or blastic lesions.  Review of the MIP images confirms the above findings.  CTA ABDOMEN AND PELVIS FINDINGS  BODY WALL: Unremarkable.  Liver: Mosaic appearance of the liver is likely from passive congestion. There is a 7 mm cyst within the anterior segment right hepatic lobe. Additional low-density foci in the liver too small to characterize.  Biliary: No evidence of biliary obstruction or stone.  Pancreas: Unremarkable.  Spleen: Unremarkable.  Adrenals: Unremarkable.  Kidneys and ureters: Early striated appearance of the kidneys which resolves on delayed imaging. 19 mm cyst in the interpolar left kidney with dependent calcification, likely layering stone rather than dystrophic mural calcification.  Bladder: Circumferential thickening of the bladder could be from chronic outlet obstruction, accentuated by underdistention. No inflammatory changes or focal thickening/ and enhancement to suggest urothelial neoplasm.  Reproductive: Unremarkable.  Bowel: No obstruction. Negative appendix.  Retroperitoneum: No mass or adenopathy.  Peritoneum: No ascites or pneumoperitoneum.  Vascular: Diffuse  atherosclerotic calcification of the aorta and branch vessels. No significant stenosis of the major aortic branches. Aortic branching is typical. There is no aneurysm or dissection. No inflammatory change around the aortic wall.  OSSEOUS: No acute abnormalities. Diffuse degenerative disc disease superimposed on a  congenitally narrow spinal canal.  Review of the MIP images confirms the above findings.  IMPRESSION: 1. CHF. 2. Negative for pulmonary embolism. 3. 19 mm nodule in the left lower lobe could reflect a neoplasm. PET CT may be useful in determining need for biopsy. 4. Mild thoracic lymphadenopathy which can be re-evaluated at follow-up. 5. No acute intra-abdominal findings. 6. Chronic appearing findings are discussed above.   Electronically Signed   By: Jorje Guild M.D.   On: 10/13/2014 05:30   Dg Chest Port 1 View  10/13/2014   CLINICAL DATA:  Shortness of breath.  EXAM: PORTABLE CHEST - 1 VIEW  COMPARISON:  CT 10/13/2014.  Chest x-ray 10/12/2014.  FINDINGS: Mediastinum and hilar structures normal be lungs are clear. Cardiomegaly. Diffuse mild interstitial prominence noted consistent with congestive heart failure No pleural effusion or pneumothorax. No acute bony abnormality.  IMPRESSION: 1. Cardiomegaly. Diffuse mild from interstitial prominence consistent with congestive heart failure. 2. Reference made to chest CT report of 10/13/2014 for discussion of pulmonary nodular density noted in the left upper lung. This is better demonstrated by CT than chest x-ray.   Electronically Signed   By: Marcello Moores  Register   On: 10/13/2014 08:07         Assessment:   1) A/C systolic HF - EF 84% by bedside echo. With severe RV dysfunction.  - Reportedly normal coronaries on cath 2013 in [place] 2) COPD 3) HTN 4) Abdominal pain  5) LLL nodule 6) Protein calorie malnutrition 7) ?CAP - blood cultures pending 8) Unintentional weight loss 9) Acute respiratory failure 10) Cachexia 11) Iron  deficient anemia  Plan/Discussion:    Leroy Kennedy is a 63 yo male with a history for CHF since 2013. This is his 4th admission to a hospital in the past 6 months.   Presented with increased SOB, chest pressure and cough for over a week and reports he was sick 2 weeks ago with fever and chills. Of note he has lost about 60 lbs in the past 6 months.  He does not appear markedly volume overloaded on exam, but will wait to see what CVP is. Will increase IV lasix to 40 mg IV BID currently. Pending co-ox to see if low output, he is warm in lower extremities. If less than 60% will start milrinone 0.25 mcg. Currently on neo to help with blood pressure. Not currently on a BB or ACE-I with soft BP.    Will get HIV, ANA, SPEP/UPEP, iron/TIBC and acute hepatitis panel.   LLL nodule concerning for neoplasm and with acute weight loss in the past 6 months along with long term tobacco abuse definitely a possibility.   Patient is not sure if he has ever had a L/RHC. Will likely need to have [facility] before discharge if he is stable to evaluate coronaries and hemodynamics.   Appreciate consult.   Length of Stay: 1 Leroy Brunt NP-C 10/13/2014, 11:50 AM  Advanced Heart Failure Team Pager [phone] (M-F; 7a - 4p)  Please contact [facility] for night-coverage after hours (4p -7a ) and weekends on [email or web address]  He has severe LV dysfunction by bedside echo and prominent S3 on exam. I suspect he has low-output HF. Echo has regional wall motion abnormalities suggestive of underlying CAD but he tells me cath in 2013 was ok. Will need to get old records. It is unclear to me if his overall deterioration is related just to his HF or if he also has an underlying malignancy. P/CCM currently  working up lung nodule.   We are awaiting co-ox. Will likely need inotropic support. Would start digoxin and spiro for now. Continue diuresis. No b-blocker yet due to low output. Check HIV and Hepatitis serologies. Will also treat  with Feraheme,   We will follow. Prognosis quite guarded. He is not candidate for advanced therapies at this point.   Gail Creekmore,MD 6:11 PM  Addendum: Co-ox 43%. -> start milrinone.   Avea Mcgowen,MD 6:14 PM

## 2014-10-13 NOTE — Progress Notes (Addendum)
Pt complaining having SOB,acute chest pain and chronic back pain. BP was 80/52 manually. VS and EKG done showing ST. Dr. Brita Romp was notified. Will continue to monitor pt

## 2014-10-14 DIAGNOSIS — R570 Cardiogenic shock: Secondary | ICD-10-CM | POA: Diagnosis present

## 2014-10-14 DIAGNOSIS — I5023 Acute on chronic systolic (congestive) heart failure: Secondary | ICD-10-CM | POA: Diagnosis present

## 2014-10-14 LAB — CBC
HCT: 30.4 % — ABNORMAL LOW (ref 39.0–52.0)
HEMOGLOBIN: 9.6 g/dL — AB (ref 13.0–17.0)
MCH: 23.6 pg — AB (ref 26.0–34.0)
MCHC: 31.6 g/dL (ref 30.0–36.0)
MCV: 74.7 fL — ABNORMAL LOW (ref 78.0–100.0)
Platelets: 291 10*3/uL (ref 150–400)
RBC: 4.07 MIL/uL — AB (ref 4.22–5.81)
RDW: 20.1 % — ABNORMAL HIGH (ref 11.5–15.5)
WBC: 8.5 10*3/uL (ref 4.0–10.5)

## 2014-10-14 LAB — BASIC METABOLIC PANEL
Anion gap: 13 (ref 5–15)
BUN: 26 mg/dL — ABNORMAL HIGH (ref 6–23)
CALCIUM: 8.5 mg/dL (ref 8.4–10.5)
CO2: 23 mEq/L (ref 19–32)
Chloride: 98 mEq/L (ref 96–112)
Creatinine, Ser: 0.98 mg/dL (ref 0.50–1.35)
GFR calc Af Amer: >90 mL/min (ref >90)
GFR calc non Af Amer: 86 mL/min — ABNORMAL LOW (ref >90)
GLUCOSE: 82 mg/dL (ref 70–99)
POTASSIUM: 4 meq/L (ref 3.7–5.3)
SODIUM: 134 meq/L — AB (ref 137–147)

## 2014-10-14 LAB — CARBOXYHEMOGLOBIN
CARBOXYHEMOGLOBIN: 2 % — AB (ref 0.5–1.5)
METHEMOGLOBIN: 0.7 % (ref 0.0–1.5)
O2 SAT: 79.7 %
TOTAL HEMOGLOBIN: 9.6 g/dL — AB (ref 13.5–18.0)

## 2014-10-14 LAB — HEPATITIS PANEL, ACUTE
HCV Ab: NEGATIVE
HEP A IGM: NONREACTIVE
HEP B C IGM: NONREACTIVE
Hepatitis B Surface Ag: NEGATIVE

## 2014-10-14 LAB — TSH: TSH: 6.41 u[IU]/mL — ABNORMAL HIGH (ref 0.350–4.500)

## 2014-10-14 LAB — ANA: ANA: NEGATIVE

## 2014-10-14 LAB — HIV ANTIBODY (ROUTINE TESTING W REFLEX): HIV 1&2 Ab, 4th Generation: NONREACTIVE

## 2014-10-14 LAB — PROCALCITONIN

## 2014-10-14 MED ORDER — MILRINONE IN DEXTROSE 20 MG/100ML IV SOLN
0.1250 ug/kg/min | INTRAVENOUS | Status: DC
  Administered 2014-10-14: 0.125 ug/kg/min via INTRAVENOUS
  Filled 2014-10-14: qty 100

## 2014-10-14 MED ORDER — SODIUM CHLORIDE 0.9 % IV BOLUS (SEPSIS)
250.0000 mL | Freq: Once | INTRAVENOUS | Status: AC
  Administered 2014-10-14: 250 mL via INTRAVENOUS

## 2014-10-14 MED ORDER — SODIUM CHLORIDE 0.9 % IV SOLN
1020.0000 mg | Freq: Once | INTRAVENOUS | Status: AC
  Administered 2014-10-14: 1020 mg via INTRAVENOUS
  Filled 2014-10-14: qty 34

## 2014-10-14 MED ORDER — DEXTROSE 5 % IV SOLN
2.0000 ug/min | INTRAVENOUS | Status: DC
  Administered 2014-10-14: 4 ug/min via INTRAVENOUS
  Administered 2014-10-15 (×2): 6 ug/min via INTRAVENOUS
  Administered 2014-10-16: 2 ug/min via INTRAVENOUS
  Administered 2014-10-18: 8 ug/min via INTRAVENOUS
  Administered 2014-10-18: 4 ug/min via INTRAVENOUS
  Administered 2014-10-20: 8 ug/min via INTRAVENOUS
  Administered 2014-10-21: 5 ug/min via INTRAVENOUS
  Filled 2014-10-14 (×11): qty 4

## 2014-10-14 NOTE — Progress Notes (Signed)
Primary service: CCM FPTS Social Note  Patient transferred to [facility] 10/12/14 due to worsened dyspnea and hypotension. We appreciate CCM care of our patient as he is persistently hypotensive and will follow along in preparation for when he hopefully returns to our service.  Leroy Sinclair, MD

## 2014-10-14 NOTE — Progress Notes (Addendum)
Advanced Heart Failure Rounding Note   Subjective:     Leroy Kennedy is a 63 yo male with a history of chronic systolic HF, COPD and tobacco abuse.   Underwent cardiac cath in 2013 in [place] after EF found to be 35%. Says cath showed normal arteries.  Moved from [place] to [place] about a week ago. Admitted with cardiogenic shock and 40 pound weight loss.   Bedside echo yesterday EF 15%. With severe RV dysfunction. Co-ox 43% Started on milrinone and IV lasix. Negative 3L. Neo added for hypotension now on 40 mcg. CVP initially 12 now ~5. Hgb 11.3->9.6 despite diuresis. Co-ox this am 79%  CT Chest: negative for PE, 19 mm nodule in LLL  Bilateral LE doppler: no DVT  Bedside echo EF 15% Inferior and inferolateral walls are HK all other walls AK. Mild MR. Severe RV dysfunction.    Objective:   Weight Range:  Vital Signs:   Temp:  [97.2 F (36.2 C)-98.4 F (36.9 C)] 98 F (36.7 C) (10/16 1554) Pulse Rate:  [91-120] 91 (10/16 1400) Resp:  [11-32] 22 (10/16 1400) BP: (58-135)/(37-97) 70/53 mmHg (10/16 1400) SpO2:  [92 %-100 %] 100 % (10/16 1400) Weight:  [59.9 kg (132 lb 0.9 oz)] 59.9 kg (132 lb 0.9 oz) (10/16 0500) Last BM Date: 10/12/14  Weight change: Filed Weights   10/12/14 1820 10/13/14 0400 10/14/14 0500  Weight: 58.2 kg (128 lb 4.9 oz) 60.2 kg (132 lb 11.5 oz) 59.9 kg (132 lb 0.9 oz)    Intake/Output:   Intake/Output Summary (Last 24 hours) at 10/14/14 1724 Last data filed at 10/14/14 1700  Gross per 24 hour  Intake 974.88 ml  Output   3975 ml  Net -3000.12 ml     Physical Exam: CVP 4 General: Chronically appearing. dyspneic with conversation  HEENT: normal except for poor dentition and temporal wasting  Neck: supple. JVP flat. Carotids 2+ bilat; no bruits. No lymphadenopathy or thryomegaly appreciated.  Cor: PMI laterally displaced. RRR. +s3. 2/6 MR  Lungs: clear, diminished in the bases  Abdomen: soft, nontender, nondistended. No hepatosplenomegaly. No bruits  or masses. Good bowel sounds.  Extremities: no cyanosis, clubbing, rash, edema  Neuro: alert & orientedx3, cranial nerves grossly intact. moves all 4 extremities w/o difficulty. Affect pleasant   Telemetry: SR 80-90s  Labs: Basic Metabolic Panel:  Recent Labs Lab 10/12/14 1316 10/13/14 0049 10/14/14 0500  NA 139 137 134*  K 4.6 4.5 4.0  CL 103 100 98  CO2 20 21 23   GLUCOSE 79 112* 82  BUN 26* 28* 26*  CREATININE 0.97 1.17 0.98  CALCIUM 9.1 9.2 8.5    Liver Function Tests:  Recent Labs Lab 10/12/14 1958  AST 19  ALT 19  ALKPHOS 113  BILITOT 0.4  PROT 7.4  ALBUMIN 3.1*    Recent Labs Lab 10/12/14 1958  LIPASE 15   No results found for this basename: AMMONIA,  in the last 168 hours  CBC:  Recent Labs Lab 10/12/14 1316 10/13/14 0049 10/14/14 0500  WBC 9.9 9.3 8.5  HGB 10.4* 11.3* 9.6*  HCT 33.1* 35.0* 30.4*  MCV 73.9* 73.1* 74.7*  PLT 294 362 291    Cardiac Enzymes:  Recent Labs Lab 10/12/14 1958 10/13/14 0049  TROPONINI <0.30 <0.30    BNP: BNP (last 3 results)  Recent Labs  10/12/14 1316  PROBNP 20243.0*     Other results:    Imaging: Ct Angio Chest Pe W/cm &/or Wo Cm  10/13/2014   CLINICAL  DATA:  Tachycardia and calf tenderness. Shortness of breath and tachycardia. Diffuse abdominal pain. Initial encounter.  EXAM: CT ANGIOGRAPHY CHEST, ABDOMEN AND PELVIS  TECHNIQUE: Multidetector CT imaging through the chest, abdomen and pelvis was performed using the standard protocol during bolus administration of intravenous contrast. Multiplanar reconstructed images and MIPs were obtained and reviewed to evaluate the vascular anatomy.  CONTRAST:  158mL OMNIPAQUE IOHEXOL 350 MG/ML SOLN  COMPARISON:  None.  FINDINGS: CTA CHEST FINDINGS  THORACIC INLET/BODY WALL:  There is adenopathy at the right thoracic inlet measuring 9 mm in short axis.  MEDIASTINUM:  Marked cardiomegaly, with particular notable left heart enlargement. No pericardial effusion.  Diffuse atherosclerosis, including the coronary arteries. On chest imaging there is no enhancement within the left heart or systemic arterial circulation. No evidence of acute aortic syndrome.  Enlarged main pulmonary artery, with the right main pulmonary artery measuring 2.4 cm. There is no pulmonary filling defect.  Mild mediastinal lymphadenopathy, most notably in upper right peritracheal lymph node which measures 14 mm short axis.  LUNG WINDOWS:  There is diffuse bronchial wall thickening with interlobular septal thickening and patchy ground-glass density. Atelectatic changes present in the lingula. There is a mass within the superior segment left lower lobe measuring 19 mm in diameter. Calcification is present within the mass, although small volume and eccentric. There is an additional nodule in the left upper lobe on image 30 tube measuring 8 mm.  OSSEOUS:  No acute fracture.  No suspicious lytic or blastic lesions.  Review of the MIP images confirms the above findings.  CTA ABDOMEN AND PELVIS FINDINGS  BODY WALL: Unremarkable.  Liver: Mosaic appearance of the liver is likely from passive congestion. There is a 7 mm cyst within the anterior segment right hepatic lobe. Additional low-density foci in the liver too small to characterize.  Biliary: No evidence of biliary obstruction or stone.  Pancreas: Unremarkable.  Spleen: Unremarkable.  Adrenals: Unremarkable.  Kidneys and ureters: Early striated appearance of the kidneys which resolves on delayed imaging. 19 mm cyst in the interpolar left kidney with dependent calcification, likely layering stone rather than dystrophic mural calcification.  Bladder: Circumferential thickening of the bladder could be from chronic outlet obstruction, accentuated by underdistention. No inflammatory changes or focal thickening/ and enhancement to suggest urothelial neoplasm.  Reproductive: Unremarkable.  Bowel: No obstruction. Negative appendix.  Retroperitoneum: No mass or  adenopathy.  Peritoneum: No ascites or pneumoperitoneum.  Vascular: Diffuse atherosclerotic calcification of the aorta and branch vessels. No significant stenosis of the major aortic branches. Aortic branching is typical. There is no aneurysm or dissection. No inflammatory change around the aortic wall.  OSSEOUS: No acute abnormalities. 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  Review of the MIP images confirms the above findings.  IMPRESSION: 1. CHF. 2. Negative for pulmonary embolism. 3. 19 mm nodule in the left lower lobe could reflect a neoplasm. PET CT may be useful in determining need for biopsy. 4. Mild thoracic lymphadenopathy which can be re-evaluated at follow-up. 5. No acute intra-abdominal findings. 6. Chronic appearing findings are discussed above.   Electronically Signed   By: Jorje Guild M.D.   On: 10/13/2014 05:30   Ct Abdomen Pelvis W Contrast  10/13/2014   CLINICAL DATA:  Tachycardia and calf tenderness. Shortness of breath and tachycardia. Diffuse abdominal pain. Initial encounter.  EXAM: CT ANGIOGRAPHY CHEST, ABDOMEN AND PELVIS  TECHNIQUE: Multidetector CT imaging through the chest, abdomen and pelvis was performed using the standard  protocol during bolus administration of intravenous contrast. Multiplanar reconstructed images and MIPs were obtained and reviewed to evaluate the vascular anatomy.  CONTRAST:  142mL OMNIPAQUE IOHEXOL 350 MG/ML SOLN  COMPARISON:  None.  FINDINGS: CTA CHEST FINDINGS  THORACIC INLET/BODY WALL:  There is adenopathy at the right thoracic inlet measuring 9 mm in short axis.  MEDIASTINUM:  Marked cardiomegaly, with particular notable left heart enlargement. No pericardial effusion. Diffuse atherosclerosis, including the coronary arteries. On chest imaging there is no enhancement within the left heart or systemic arterial circulation. No evidence of acute aortic syndrome.  Enlarged main pulmonary artery, with the right  main pulmonary artery measuring 2.4 cm. There is no pulmonary filling defect.  Mild mediastinal lymphadenopathy, most notably in upper right peritracheal lymph node which measures 14 mm short axis.  LUNG WINDOWS:  There is diffuse bronchial wall thickening with interlobular septal thickening and patchy ground-glass density. Atelectatic changes present in the lingula. There is a mass within the superior segment left lower lobe measuring 19 mm in diameter. Calcification is present within the mass, although small volume and eccentric. There is an additional nodule in the left upper lobe on image 30 tube measuring 8 mm.  OSSEOUS:  No acute fracture.  No suspicious lytic or blastic lesions.  Review of the MIP images confirms the above findings.  CTA ABDOMEN AND PELVIS FINDINGS  BODY WALL: Unremarkable.  Liver: Mosaic appearance of the liver is likely from passive congestion. There is a 7 mm cyst within the anterior segment right hepatic lobe. Additional low-density foci in the liver too small to characterize.  Biliary: No evidence of biliary obstruction or stone.  Pancreas: Unremarkable.  Spleen: Unremarkable.  Adrenals: Unremarkable.  Kidneys and ureters: Early striated appearance of the kidneys which resolves on delayed imaging. 19 mm cyst in the interpolar left kidney with dependent calcification, likely layering stone rather than dystrophic mural calcification.  Bladder: Circumferential thickening of the bladder could be from chronic outlet obstruction, accentuated by underdistention. No inflammatory changes or focal thickening/ and enhancement to suggest urothelial neoplasm.  Reproductive: Unremarkable.  Bowel: No obstruction. Negative appendix.  Retroperitoneum: No mass or adenopathy.  Peritoneum: No ascites or pneumoperitoneum.  Vascular: Diffuse atherosclerotic calcification of the aorta and branch vessels. No significant stenosis of the major aortic branches. Aortic branching is typical. There is no aneurysm or  dissection. No inflammatory change around the aortic wall.  OSSEOUS: No acute abnormalities. 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  Review of the MIP images confirms the above findings.  IMPRESSION: 1. CHF. 2. Negative for pulmonary embolism. 3. 19 mm nodule in the left lower lobe could reflect a neoplasm. PET CT may be useful in determining need for biopsy. 4. Mild thoracic lymphadenopathy which can be re-evaluated at follow-up. 5. No acute intra-abdominal findings. 6. Chronic appearing findings are discussed above.   Electronically Signed   By: Jorje Guild M.D.   On: 10/13/2014 05:30   Dg Chest Port 1 View  10/13/2014   CLINICAL DATA:  Line placement.  EXAM: PORTABLE CHEST - 1 VIEW  COMPARISON:  Single view of the chest 10/13/2014 at 5:37 a.m.  FINDINGS: A new left IJ catheter is in place with the tip projecting over the mid to lower superior vena cava. No pneumothorax is identified. Lung volumes are lower than on the comparison examination with basilar atelectasis. Hazy opacity in the left mid and lower chest may be due to pleural effusion and atelectasis.  IMPRESSION:  Tip of left IJ catheter projects over the lower superior vena cava. Negative for pneumothorax.  Some worsening in aeration left mid and lower lung zones which could be due to a small effusion and atelectasis.   Electronically Signed   By: Inge Rise M.D.   On: 10/13/2014 12:19   Dg Chest Port 1 View  10/13/2014   CLINICAL DATA:  Shortness of breath.  EXAM: PORTABLE CHEST - 1 VIEW  COMPARISON:  CT 10/13/2014.  Chest x-ray 10/12/2014.  FINDINGS: Mediastinum and hilar structures normal be lungs are clear. Cardiomegaly. Diffuse mild interstitial prominence noted consistent with congestive heart failure No pleural effusion or pneumothorax. No acute bony abnormality.  IMPRESSION: 1. Cardiomegaly. Diffuse mild from interstitial prominence consistent with congestive heart failure. 2.  Reference made to chest CT report of 10/13/2014 for discussion of pulmonary nodular density noted in the left upper lung. This is better demonstrated by CT than chest x-ray.   Electronically Signed   By: Marcello Moores  Register   On: 10/13/2014 08:07      Medications:     Scheduled Medications: . Chlorhexidine Gluconate Cloth  6 each Topical Q0600  . digoxin  0.125 mg Oral Daily  . feeding supplement (ENSURE COMPLETE)  237 mL Oral TID BM  . heparin subcutaneous  5,000 Units Subcutaneous 3 times per day  . ipratropium-albuterol  3 mL Nebulization Q6H  . mupirocin ointment  1 application Nasal BID  . pantoprazole  40 mg Oral Daily  . simvastatin  40 mg Oral Daily  . spironolactone  12.5 mg Oral Daily     Infusions: . milrinone 0.25 mcg/kg/min (10/14/14 1700)  . phenylephrine (NEO-SYNEPHRINE) Adult infusion 40 mcg/min (10/14/14 1700)     PRN Medications:  albuterol, morphine injection, ondansetron (ZOFRAN) IV   Assessment:   1) Cardiogenic shock 2) A/C systolic HF  - EF 81% by bedside echo. With severe RV dysfunction.  - Reportedly normal coronaries on cath 2013 in [place]  3) COPD  4) LLL nodule  5) Unintentional weight loss  6) Acute respiratory failure  7) Cachexia  8) Iron deficient anemia   Plan/Discussion:    Co-ox is improved however he remains hypotensive and pressor dependent. CVP now 4. Will decrease milrinone and change neo to levophed.  Stop lasix and give back 250 NS as he is likely preload dependent in setting of poor RV function.   I am disturbed by his profound weight loss over the past 6 months. This may be from cardiac cachexia but seems quite dramatic. He tells me that he often went hungry in [place] and didn't eat well. HIV and hepatitis serologies are negative. He has a small lung nodule but no clear evidence of an advanced oncologic process. However he does have an iron def anemia and colon CA must be considered. He received most of his care at [facility], [facility] and [facility] in [place] and I have asked to obtain old records from these facilities. Will request dietary consult and liberalize his diet. Check TSH. FOBT.   HGb dropping. Will repeat CBC. Needs Feraheme.   The patient is critically ill with multiple organ systems failure and requires high complexity decision making for assessment and support, frequent evaluation and titration of therapies, application of advanced monitoring technologies and extensive interpretation of multiple databases.   D/W Dr. [name].  Critical Care Time devoted to patient care services described in this note is 35 Minutes.   Length of Stay: 2  Glori Bickers MD 10/14/2014, 5:24 PM  Advanced Heart Failure Team Pager [phone] (M-F; 7a - 4p)  Please contact [facility] for night-coverage after hours (4p -7a ) and weekends on [email or web address]

## 2014-10-14 NOTE — Progress Notes (Signed)
PULMONARY / CRITICAL CARE MEDICINE   Name: Leroy Kennedy MRN: [id number] DOB: March 10, 1951    ADMISSION DATE:  10/12/2014 CONSULTATION DATE:  10/13/2014  REFERRING MD :  Dr Mingo Amber  CHIEF COMPLAINT:  SOB  INITIAL PRESENTATION: 63 year old male presented to ED 10/14 c/o SOB, CP. He was admitted for presumed [facility] CHF and treated with diuresis. 10/15 early AM he became acutely more dyspneic and hypotensive with CP. D-dimer was ordered and came back positive. He was taken for CTA chest and transferred to ICU. PCCM asked to take over care.    STUDIES:  CTA chest 10/15  >>> 19 mm nodule LLL, chf, no pe  SIGNIFICANT EVENTS: 10/14 admitted for CHF exacerbation 10/15 transferred to ICU with CP, SOB, distress 10/16- milrinone, remains in shock on neo  SUBJECTIVE: rest status better after neg 600 cc balance  VITAL SIGNS: Temp:  [97 F (36.1 C)-98.4 F (36.9 C)] 97.8 F (36.6 C) (10/15 2337) Pulse Rate:  [95-115] 98 (10/16 0530) Resp:  [11-32] 22 (10/16 0530) BP: (58-135)/(37-97) 88/68 mmHg (10/16 0530) SpO2:  [92 %-100 %] 94 % (10/16 0530) Weight:  [132 lb 0.9 oz (59.9 kg)] 132 lb 0.9 oz (59.9 kg) (10/16 0500) HEMODYNAMICS: CVP:  [15 mmHg-20 mmHg] 15 mmHg VENTILATOR SETTINGS:   INTAKE / OUTPUT:  Intake/Output Summary (Last 24 hours) at 10/14/14 0607 Last data filed at 10/14/14 0300  Gross per 24 hour  Intake 1418.08 ml  Output   2290 ml  Net -871.92 ml    PHYSICAL EXAMINATION: General:  Thin male in NAD Neuro:  Alert, oriented, no focal deficit HEENT:  [place]/AT, PERRL, jvd noted Cardiovascular:  Tachy, regular, no MRG Lungs:  Respirations even, unlabored, diminished bibasilar breath sounds  Abdomen:  Soft, diffusely tender. BS normoactive Musculoskeletal:  NO acute deformity, no edema Skin:  Intact  LABS:  CBC  Recent Labs Lab 10/12/14 1316 10/13/14 0049 10/14/14 0500  WBC 9.9 9.3 8.5  HGB 10.4* 11.3* 9.6*  HCT 33.1* 35.0* 30.4*  PLT 294 362 291   Coag's  Recent  Labs Lab 10/13/14 0730  INR 1.37   BMET  Recent Labs Lab 10/12/14 1316 10/13/14 0049  NA 139 137  K 4.6 4.5  CL 103 100  CO2 20 21  BUN 26* 28*  CREATININE 0.97 1.17  GLUCOSE 79 112*   Electrolytes  Recent Labs Lab 10/12/14 1316 10/13/14 0049  CALCIUM 9.1 9.2   Sepsis Markers  Recent Labs Lab 10/12/14 1958 10/13/14 0730 10/13/14 0950  LATICACIDVEN 2.0 3.7*  --   PROCALCITON  --   --  <0.10   ABG  Recent Labs Lab 10/13/14 0602  PHART 7.517*  PCO2ART 19.0*  PO2ART 128.0*   Liver Enzymes  Recent Labs Lab 10/12/14 1958  AST 19  ALT 19  ALKPHOS 113  BILITOT 0.4  ALBUMIN 3.1*   Cardiac Enzymes  Recent Labs Lab 10/12/14 1316 10/12/14 1958 10/13/14 0049  TROPONINI  --  <0.30 <0.30  PROBNP 20243.0*  --   --    Glucose  Recent Labs Lab 10/13/14 0432  GLUCAP 90    Imaging Ct Angio Chest Pe W/cm &/or Wo Cm  10/13/2014   CLINICAL DATA:  Tachycardia and calf tenderness. Shortness of breath and tachycardia. Diffuse abdominal pain. Initial encounter.  EXAM: CT ANGIOGRAPHY CHEST, ABDOMEN AND PELVIS  TECHNIQUE: Multidetector CT imaging through the chest, abdomen and pelvis was performed using the standard protocol during bolus administration of intravenous contrast. Multiplanar reconstructed images and MIPs  were obtained and reviewed to evaluate the vascular anatomy.  CONTRAST:  131mL OMNIPAQUE IOHEXOL 350 MG/ML SOLN  COMPARISON:  None.  FINDINGS: CTA CHEST FINDINGS  THORACIC INLET/BODY WALL:  There is adenopathy at the right thoracic inlet measuring 9 mm in short axis.  MEDIASTINUM:  Marked cardiomegaly, with particular notable left heart enlargement. No pericardial effusion. Diffuse atherosclerosis, including the coronary arteries. On chest imaging there is no enhancement within the left heart or systemic arterial circulation. No evidence of acute aortic syndrome.  Enlarged main pulmonary artery, with the right main pulmonary artery measuring 2.4 cm.  There is no pulmonary filling defect.  Mild mediastinal lymphadenopathy, most notably in upper right peritracheal lymph node which measures 14 mm short axis.  LUNG WINDOWS:  There is diffuse bronchial wall thickening with interlobular septal thickening and patchy ground-glass density. Atelectatic changes present in the lingula. There is a mass within the superior segment left lower lobe measuring 19 mm in diameter. Calcification is present within the mass, although small volume and eccentric. There is an additional nodule in the left upper lobe on image 30 tube measuring 8 mm.  OSSEOUS:  No acute fracture.  No suspicious lytic or blastic lesions.  Review of the MIP images confirms the above findings.  CTA ABDOMEN AND PELVIS FINDINGS  BODY WALL: Unremarkable.  Liver: Mosaic appearance of the liver is likely from passive congestion. There is a 7 mm cyst within the anterior segment right hepatic lobe. Additional low-density foci in the liver too small to characterize.  Biliary: No evidence of biliary obstruction or stone.  Pancreas: Unremarkable.  Spleen: Unremarkable.  Adrenals: Unremarkable.  Kidneys and ureters: Early striated appearance of the kidneys which resolves on delayed imaging. 19 mm cyst in the interpolar left kidney with dependent calcification, likely layering stone rather than dystrophic mural calcification.  Bladder: Circumferential thickening of the bladder could be from chronic outlet obstruction, accentuated by underdistention. No inflammatory changes or focal thickening/ and enhancement to suggest urothelial neoplasm.  Reproductive: Unremarkable.  Bowel: No obstruction. Negative appendix.  Retroperitoneum: No mass or adenopathy.  Peritoneum: No ascites or pneumoperitoneum.  Vascular: Diffuse atherosclerotic calcification of the aorta and branch vessels. No significant stenosis of the major aortic branches. Aortic branching is typical. There is no aneurysm or dissection. No inflammatory change  around the aortic wall.  OSSEOUS: No acute abnormalities. 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  Review of the MIP images confirms the above findings.  IMPRESSION: 1. CHF. 2. Negative for pulmonary embolism. 3. 19 mm nodule in the left lower lobe could reflect a neoplasm. PET CT may be useful in determining need for biopsy. 4. Mild thoracic lymphadenopathy which can be re-evaluated at follow-up. 5. No acute intra-abdominal findings. 6. Chronic appearing findings are discussed above.   Electronically Signed   By: Jorje Guild M.D.   On: 10/13/2014 05:30   Ct Abdomen Pelvis W Contrast  10/13/2014   CLINICAL DATA:  Tachycardia and calf tenderness. Shortness of breath and tachycardia. Diffuse abdominal pain. Initial encounter.  EXAM: CT ANGIOGRAPHY CHEST, ABDOMEN AND PELVIS  TECHNIQUE: Multidetector CT imaging through the chest, abdomen and pelvis was performed using the standard protocol during bolus administration of intravenous contrast. Multiplanar reconstructed images and MIPs were obtained and reviewed to evaluate the vascular anatomy.  CONTRAST:  133mL OMNIPAQUE IOHEXOL 350 MG/ML SOLN  COMPARISON:  None.  FINDINGS: CTA CHEST FINDINGS  THORACIC INLET/BODY WALL:  There is adenopathy at the right thoracic inlet  measuring 9 mm in short axis.  MEDIASTINUM:  Marked cardiomegaly, with particular notable left heart enlargement. No pericardial effusion. Diffuse atherosclerosis, including the coronary arteries. On chest imaging there is no enhancement within the left heart or systemic arterial circulation. No evidence of acute aortic syndrome.  Enlarged main pulmonary artery, with the right main pulmonary artery measuring 2.4 cm. There is no pulmonary filling defect.  Mild mediastinal lymphadenopathy, most notably in upper right peritracheal lymph node which measures 14 mm short axis.  LUNG WINDOWS:  There is diffuse bronchial wall thickening with interlobular septal  thickening and patchy ground-glass density. Atelectatic changes present in the lingula. There is a mass within the superior segment left lower lobe measuring 19 mm in diameter. Calcification is present within the mass, although small volume and eccentric. There is an additional nodule in the left upper lobe on image 30 tube measuring 8 mm.  OSSEOUS:  No acute fracture.  No suspicious lytic or blastic lesions.  Review of the MIP images confirms the above findings.  CTA ABDOMEN AND PELVIS FINDINGS  BODY WALL: Unremarkable.  Liver: Mosaic appearance of the liver is likely from passive congestion. There is a 7 mm cyst within the anterior segment right hepatic lobe. Additional low-density foci in the liver too small to characterize.  Biliary: No evidence of biliary obstruction or stone.  Pancreas: Unremarkable.  Spleen: Unremarkable.  Adrenals: Unremarkable.  Kidneys and ureters: Early striated appearance of the kidneys which resolves on delayed imaging. 19 mm cyst in the interpolar left kidney with dependent calcification, likely layering stone rather than dystrophic mural calcification.  Bladder: Circumferential thickening of the bladder could be from chronic outlet obstruction, accentuated by underdistention. No inflammatory changes or focal thickening/ and enhancement to suggest urothelial neoplasm.  Reproductive: Unremarkable.  Bowel: No obstruction. Negative appendix.  Retroperitoneum: No mass or adenopathy.  Peritoneum: No ascites or pneumoperitoneum.  Vascular: Diffuse atherosclerotic calcification of the aorta and branch vessels. No significant stenosis of the major aortic branches. Aortic branching is typical. There is no aneurysm or dissection. No inflammatory change around the aortic wall.  OSSEOUS: No acute abnormalities. 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  Review of the MIP images confirms the above findings.  IMPRESSION: 1. CHF. 2. Negative for pulmonary  embolism. 3. 19 mm nodule in the left lower lobe could reflect a neoplasm. PET CT may be useful in determining need for biopsy. 4. Mild thoracic lymphadenopathy which can be re-evaluated at follow-up. 5. No acute intra-abdominal findings. 6. Chronic appearing findings are discussed above.   Electronically Signed   By: Jorje Guild M.D.   On: 10/13/2014 05:30   Dg Chest Port 1 View  10/13/2014   CLINICAL DATA:  Line placement.  EXAM: PORTABLE CHEST - 1 VIEW  COMPARISON:  Single view of the chest 10/13/2014 at 5:37 a.m.  FINDINGS: A new left IJ catheter is in place with the tip projecting over the mid to lower superior vena cava. No pneumothorax is identified. Lung volumes are lower than on the comparison examination with basilar atelectasis. Hazy opacity in the left mid and lower chest may be due to pleural effusion and atelectasis.  IMPRESSION: Tip of left IJ catheter projects over the lower superior vena cava. Negative for pneumothorax.  Some worsening in aeration left mid and lower lung zones which could be due to a small effusion and atelectasis.   Electronically Signed   By: Inge Rise M.D.   On: 10/13/2014 12:19  Dg Chest Port 1 View  10/13/2014   CLINICAL DATA:  Shortness of breath.  EXAM: PORTABLE CHEST - 1 VIEW  COMPARISON:  CT 10/13/2014.  Chest x-ray 10/12/2014.  FINDINGS: Mediastinum and hilar structures normal be lungs are clear. Cardiomegaly. Diffuse mild interstitial prominence noted consistent with congestive heart failure No pleural effusion or pneumothorax. No acute bony abnormality.  IMPRESSION: 1. Cardiomegaly. Diffuse mild from interstitial prominence consistent with congestive heart failure. 2. Reference made to chest CT report of 10/13/2014 for discussion of pulmonary nodular density noted in the left upper lung. This is better demonstrated by CT than chest x-ray.   Electronically Signed   By: Marcello Moores  Register   On: 10/13/2014 08:07     ASSESSMENT /  PLAN:  PULMONARY A: Acute resp failure CHF exacerbation  H/o COPD Tobacco abuse 7mm LLL nodule? Malignancy? P:   Supplemental O2 to maintain SpO2 greater than 92%. Repeat CXR Continue scheduled BD's Cough suppression Lasix to neg balance as BP tolerates  BIPAP 4hr on 2 off X24 hrs, not required can dc from room  CARDIOVASCULAR CVL 10/15 A:  Shock, cardiogenic vs obstructive Acute on chronic CHF H/o HTN Bedside ECHO per Dr. Haroldine Laws EF 15% with hypokinesis of inferior and inferolateral walls, mild MR, severe RV dysfunction  P:  MAP goal 55 mm/Hg Co-ox 43% --> started on milrinone improved  Phenylephrine, titrate to achieve MAP goal 55 (currently at 44mcg), goal to dc this today, if unable then transition to levophed  Spiro 12.5 and dig 0.125mg   Lasix 40mg  BID  F/u Echo offically Hold home BB Appreciate assistance of Chf service Net neg 1.8L and stable weight 132lbs  RENAL A:   At risk for cardiorenal Hyponatremia Improved renal fxn with neg balance P:   Follow Bmet Milrinone to maintain kvo  GASTROINTESTINAL A:   H/o GERD P:   Continue preadmission PPI Could try to advance diet now that acute resp concern improved    HEMATOLOGIC A:   Possible PE/DVT, D-dimer elevated, doppler neg, low clincial suspicion Anemia 10.7 --> 9.6 P:  Doppler neg Subq heparin  SCDs Follow CBC in am  Feraheme, do not favor iron in icu, consider dc until dc from unit, consider hold  INFECTIOUS A:   R/o CAP vs bronchitis Unimpressive CT Neg pct trend P:   BCx2 10/14 >>> Sputum 10/14 >>> Abx: Levaquin 10/15 >>>10/16 Trend PCT to limit abx successful  HIV, Hepatitis panel   ENDOCRINE A:   R/o rel AI Cortisol 30 P:   Monitor Glucose on BMET  NEUROLOGIC A:   Chronic pain P:   Monitor for signs of withdrawal  evalu home med list, if needed add nacotis to reduce WD  Today's summary: cont to monitor for improvement in cardiac status now on milrinone, pressors  to MAP goal 55, potential ability to pull of abx - have dc'ed, svo2 improved, dc BIPAP from room  Bernadene Bell, MD Family Medicine PGY-2 Please page or call with questions  Ccm time 30 min   I have fully examined this patient and agree with above findings.    And edited infull  [name]. [name], MD, [place] Pgr: [facility]

## 2014-10-15 DIAGNOSIS — I059 Rheumatic mitral valve disease, unspecified: Secondary | ICD-10-CM

## 2014-10-15 DIAGNOSIS — J9601 Acute respiratory failure with hypoxia: Secondary | ICD-10-CM

## 2014-10-15 DIAGNOSIS — I5023 Acute on chronic systolic (congestive) heart failure: Principal | ICD-10-CM

## 2014-10-15 LAB — CARBOXYHEMOGLOBIN
CARBOXYHEMOGLOBIN: 1.3 % (ref 0.5–1.5)
Carboxyhemoglobin: 1.7 % — ABNORMAL HIGH (ref 0.5–1.5)
Methemoglobin: 1.8 % — ABNORMAL HIGH (ref 0.0–1.5)
Methemoglobin: 1.9 % — ABNORMAL HIGH (ref 0.0–1.5)
O2 SAT: 42 %
O2 Saturation: 44.5 %
TOTAL HEMOGLOBIN: 10.2 g/dL — AB (ref 13.5–18.0)
TOTAL HEMOGLOBIN: 10.8 g/dL — AB (ref 13.5–18.0)

## 2014-10-15 LAB — CBC
HCT: 31.1 % — ABNORMAL LOW (ref 39.0–52.0)
Hemoglobin: 9.9 g/dL — ABNORMAL LOW (ref 13.0–17.0)
MCH: 23.9 pg — ABNORMAL LOW (ref 26.0–34.0)
MCHC: 31.8 g/dL (ref 30.0–36.0)
MCV: 74.9 fL — ABNORMAL LOW (ref 78.0–100.0)
PLATELETS: 277 10*3/uL (ref 150–400)
RBC: 4.15 MIL/uL — ABNORMAL LOW (ref 4.22–5.81)
RDW: 19.7 % — AB (ref 11.5–15.5)
WBC: 9 10*3/uL (ref 4.0–10.5)

## 2014-10-15 LAB — EXPECTORATED SPUTUM ASSESSMENT W GRAM STAIN, RFLX TO RESP C

## 2014-10-15 LAB — BASIC METABOLIC PANEL
Anion gap: 12 (ref 5–15)
BUN: 18 mg/dL (ref 6–23)
CO2: 23 mEq/L (ref 19–32)
Calcium: 8.9 mg/dL (ref 8.4–10.5)
Chloride: 95 mEq/L — ABNORMAL LOW (ref 96–112)
Creatinine, Ser: 0.82 mg/dL (ref 0.50–1.35)
Glucose, Bld: 89 mg/dL (ref 70–99)
Potassium: 4.5 mEq/L (ref 3.7–5.3)
SODIUM: 130 meq/L — AB (ref 137–147)

## 2014-10-15 LAB — EXPECTORATED SPUTUM ASSESSMENT W REFEX TO RESP CULTURE

## 2014-10-15 LAB — T4, FREE: FREE T4: 1.42 ng/dL (ref 0.80–1.80)

## 2014-10-15 LAB — PROCALCITONIN: Procalcitonin: <0.1 ng/mL

## 2014-10-15 MED ORDER — FUROSEMIDE 40 MG PO TABS
40.0000 mg | ORAL_TABLET | Freq: Two times a day (BID) | ORAL | Status: DC
  Administered 2014-10-15 – 2014-10-17 (×5): 40 mg via ORAL
  Filled 2014-10-15 (×7): qty 1

## 2014-10-15 MED ORDER — ENOXAPARIN SODIUM 40 MG/0.4ML ~~LOC~~ SOLN
40.0000 mg | SUBCUTANEOUS | Status: DC
  Administered 2014-10-15 – 2014-10-19 (×5): 40 mg via SUBCUTANEOUS
  Filled 2014-10-15 (×6): qty 0.4

## 2014-10-15 MED ORDER — DOBUTAMINE IN D5W 4-5 MG/ML-% IV SOLN
5.0000 ug/kg/min | INTRAVENOUS | Status: DC
  Administered 2014-10-15 – 2014-10-22 (×4): 5 ug/kg/min via INTRAVENOUS
  Filled 2014-10-15 (×7): qty 250

## 2014-10-15 NOTE — Progress Notes (Signed)
PULMONARY / CRITICAL CARE MEDICINE   Name: Leroy Kennedy MRN: [id number] DOB: 11/15/1951    ADMISSION DATE:  10/12/2014 CONSULTATION DATE:  10/13/2014  REFERRING MD :  Dr Mingo Amber  CHIEF COMPLAINT:  SOB  INITIAL PRESENTATION: 63 year old male admitted 10/14 for presumed [facility] CHF and treated with diuresis. 10/15 early AM he became acutely more dyspneic and hypotensive with CP. Transferred to ICU. PCCM asked to take over care.    STUDIES:  CTA chest 10/15  >>> 19 mm nodule LLL, chf, no pe BLE doppler 10/15>>> NEG DVT  SIGNIFICANT EVENTS: 10/14 admitted for CHF exacerbation 10/15 transferred to ICU with CP, SOB, distress 10/16- milrinone, remains in shock on neo  SUBJECTIVE/ OVERNIGHT: Remains on milrinone, low dose levophed.  Denies SOB, chest pain.  CVP 15 this am.  Did not require bipap overnight.   VITAL SIGNS: Temp:  [97.2 F (36.2 C)-98.8 F (37.1 C)] 98.8 F (37.1 C) (10/17 0800) Pulse Rate:  [81-110] 98 (10/17 1000) Resp:  [15-29] 21 (10/17 1000) BP: (61-102)/(37-78) 81/62 mmHg (10/17 1000) SpO2:  [91 %-100 %] 96 % (10/17 1000) Weight:  [130 lb 15.3 oz (59.4 kg)] 130 lb 15.3 oz (59.4 kg) (10/17 0500) HEMODYNAMICS: CVP:  [0 mmHg-10 mmHg] 8 mmHg VENTILATOR SETTINGS:   INTAKE / OUTPUT:  Intake/Output Summary (Last 24 hours) at 10/15/14 1208 Last data filed at 10/15/14 0900  Gross per 24 hour  Intake 1064.9 ml  Output   1350 ml  Net -285.1 ml    PHYSICAL EXAMINATION: General:  Thin male in NAD Neuro:  Alert, oriented, no focal deficit HEENT:  [place]/AT, PERRL, jvd noted Cardiovascular:  regular, no MRG Lungs:  Respirations even, unlabored, diminished bibasilar breath sounds  Abdomen:  Soft, diffusely tender. BS normoactive Musculoskeletal:  NO acute deformity, no edema Skin:  Intact  LABS:  CBC  Recent Labs Lab 10/13/14 0049 10/14/14 0500 10/15/14 0500  WBC 9.3 8.5 9.0  HGB 11.3* 9.6* 9.9*  HCT 35.0* 30.4* 31.1*  PLT 362 291 277   Coag's  Recent  Labs Lab 10/13/14 0730  INR 1.37   BMET  Recent Labs Lab 10/13/14 0049 10/14/14 0500 10/15/14 0500  NA 137 134* 130*  K 4.5 4.0 4.5  CL 100 98 95*  CO2 21 23 23   BUN 28* 26* 18  CREATININE 1.17 0.98 0.82  GLUCOSE 112* 82 89   Electrolytes  Recent Labs Lab 10/13/14 0049 10/14/14 0500 10/15/14 0500  CALCIUM 9.2 8.5 8.9   Sepsis Markers  Recent Labs Lab 10/12/14 1958 10/13/14 0730 10/13/14 0950 10/14/14 0500 10/15/14 0500  LATICACIDVEN 2.0 3.7*  --   --   --   PROCALCITON  --   --  <0.10 <0.10 <0.10   ABG  Recent Labs Lab 10/13/14 0602  PHART 7.517*  PCO2ART 19.0*  PO2ART 128.0*   Liver Enzymes  Recent Labs Lab 10/12/14 1958  AST 19  ALT 19  ALKPHOS 113  BILITOT 0.4  ALBUMIN 3.1*   Cardiac Enzymes  Recent Labs Lab 10/12/14 1316 10/12/14 1958 10/13/14 0049  TROPONINI  --  <0.30 <0.30  PROBNP 20243.0*  --   --    Glucose  Recent Labs Lab 10/13/14 0432  GLUCAP 90    Imaging No results found.   ASSESSMENT / PLAN:  PULMONARY A: Acute resp failure - r/t acute on chronic CHF with underlying COPD, active smoker H/o COPD Tobacco abuse 22mm LLL nodule? Malignancy? P:   Supplemental O2 to maintain SpO2 greater than  92%. Repeat CXR in am  Continue scheduled BD's Cough suppression Diuresis per cards    CARDIOVASCULAR CVL L IJ 10/15 A:  Shock, cardiogenic vs obstructive Acute on chronic CHF H/o HTN Bedside ECHO per Dr. Haroldine Laws EF 15% with hypokinesis of inferior and inferolateral walls, mild MR, severe RV dysfunction  P:  MAP goal 55 mm/Hg Transition from milrinone to dobutamine  Repeat co-ox  Low dose levophed - titrate to off as able  Spiro 12.5 and dig 0.125mg   Diuresis per cards  F/u co-ox  F/u official echo read  Hold home BB   RENAL A:   At risk for cardiorenal Hyponatremia Improved renal fxn with neg balance P:   Follow Bmet Dobutamine  KVO IVF   GASTROINTESTINAL A:   H/o GERD Weight loss -  profound weight loss over 6 months P:   Continue preadmission PPI Advance diet as tol  W/u malignancy once acute issues improved    HEMATOLOGIC A:   Possible PE/DVT, D-dimer elevated, doppler neg, low clincial suspicion Anemia 10.7 --> 9.6 P:  Doppler neg Subq heparin  SCDs Follow CBC in am    INFECTIOUS A:   R/o CAP vs bronchitis Unimpressive CT Neg pct trend P:   BCx2 10/14 >>> Sputum 10/14 >>> Abx: Levaquin 10/15 >>>10/16 Trend PCT to limit abx successful  HIV, Hepatitis panel NEG  ENDOCRINE A:   R/o rel AI Cortisol 30 P:   Monitor Glucose on BMET  NEUROLOGIC A:   Chronic pain P:   PRN morphine  Hold home meds    Today's summary:  Transition milrinone to dobutamine per cards with continued low co-ox. Cont low dose pressors for BP support and cont diuresis. Discussed with Dr Haroldine Laws. OK to  tx 2H SDU and back to FPTS as CHF team managing pressors,  And he is off bipap.    Merton Border, MD ; [facility] [phone].  After 5:30 PM or weekends, call [phone]   10/15/2014  12:08 PM

## 2014-10-15 NOTE — Progress Notes (Signed)
Nutrition Note  -Received consult for assessment -Initial assessment completed on 10/15 -Continue with recommendations for nutrition supplementation and liberalized diet -Will continue to monitor per protocols  Atlee Abide [place] LDN Clinical Dietitian [phone]

## 2014-10-15 NOTE — Progress Notes (Signed)
Echo Lab  2D Echocardiogram completed.  [place], RDCS 10/15/2014 10:27 AM

## 2014-10-15 NOTE — Progress Notes (Signed)
Advanced Heart Failure Rounding Note   Subjective:    Leroy Kennedy is a 63 yo male with a history of chronic systolic HF, COPD and tobacco abuse.   Underwent cardiac cath in 2013 in [place] after EF found to be 35%. Says cath showed normal arteries.  Moved from [place] to [place] about a week ago. Admitted with cardiogenic shock and 40 pound weight loss.   Bedside echo  EF 15%. With severe RV dysfunction. Co-ox 43% Started on milrinone and IV lasix. Yesterday neo switched to levophed and given some IVF due to low CVP.    He remains on milrinone 0.125. Levophed 6. Co-ox this am 45%. CVP 15. SBP 77-92.  Procalcitonin < 0.10. Feels lightheaded. No dyspnea.   CT Chest: negative for PE, 19 mm nodule in LLL  Bilateral LE doppler: no DVT  Bedside echo EF 15% Inferior and inferolateral walls are HK all other walls AK. Mild MR. Severe RV dysfunction.    Objective:   Weight Range:  Vital Signs:   Temp:  [97.2 F (36.2 C)-98.8 F (37.1 C)] 98.8 F (37.1 C) (10/17 0800) Pulse Rate:  [81-110] 89 (10/17 0919) Resp:  [15-29] 22 (10/17 0600) BP: (61-102)/(37-78) 77/61 mmHg (10/17 0600) SpO2:  [91 %-100 %] 94 % (10/17 0600) Weight:  [59.4 kg (130 lb 15.3 oz)] 59.4 kg (130 lb 15.3 oz) (10/17 0500) Last BM Date: 10/12/14  Weight change: Filed Weights   10/13/14 0400 10/14/14 0500 10/15/14 0500  Weight: 60.2 kg (132 lb 11.5 oz) 59.9 kg (132 lb 0.9 oz) 59.4 kg (130 lb 15.3 oz)    Intake/Output:   Intake/Output Summary (Last 24 hours) at 10/15/14 1028 Last data filed at 10/15/14 0900  Gross per 24 hour  Intake 1127.19 ml  Output   1850 ml  Net -722.81 ml     Physical Exam: CVP 15 General: Chronically appearing. dyspneic with conversation  HEENT: normal except for poor dentition and temporal wasting  Neck: supple. JVP jaw. Carotids 2+ bilat; no bruits. No lymphadenopathy or thryomegaly appreciated.  Cor: PMI laterally displaced. RRR. +s3. 2/6 MR  Lungs: clear, diminished in the bases   Abdomen: soft, nontender, nondistended. No hepatosplenomegaly. No bruits or masses. Good bowel sounds.  Extremities: no cyanosis, clubbing, rash, edema  Neuro: alert & orientedx3, cranial nerves grossly intact. moves all 4 extremities w/o difficulty. Affect pleasant  Telemetry: SR 80-90s  Labs: Basic Metabolic Panel:  Recent Labs Lab 10/12/14 1316 10/13/14 0049 10/14/14 0500 10/15/14 0500  NA 139 137 134* 130*  K 4.6 4.5 4.0 4.5  CL 103 100 98 95*  CO2 20 21 23 23   GLUCOSE 79 112* 82 89  BUN 26* 28* 26* 18  CREATININE 0.97 1.17 0.98 0.82  CALCIUM 9.1 9.2 8.5 8.9    Liver Function Tests:  Recent Labs Lab 10/12/14 1958  AST 19  ALT 19  ALKPHOS 113  BILITOT 0.4  PROT 7.4  ALBUMIN 3.1*    Recent Labs Lab 10/12/14 1958  LIPASE 15   No results found for this basename: AMMONIA,  in the last 168 hours  CBC:  Recent Labs Lab 10/12/14 1316 10/13/14 0049 10/14/14 0500 10/15/14 0500  WBC 9.9 9.3 8.5 9.0  HGB 10.4* 11.3* 9.6* 9.9*  HCT 33.1* 35.0* 30.4* 31.1*  MCV 73.9* 73.1* 74.7* 74.9*  PLT 294 362 291 277    Cardiac Enzymes:  Recent Labs Lab 10/12/14 1958 10/13/14 0049  TROPONINI <0.30 <0.30    BNP: BNP (last 3 results)  Recent Labs  10/12/14 1316  PROBNP 20243.0*     Other results:    Imaging: Dg Chest Port 1 View  10/13/2014   CLINICAL DATA:  Line placement.  EXAM: PORTABLE CHEST - 1 VIEW  COMPARISON:  Single view of the chest 10/13/2014 at 5:37 a.m.  FINDINGS: A new left IJ catheter is in place with the tip projecting over the mid to lower superior vena cava. No pneumothorax is identified. Lung volumes are lower than on the comparison examination with basilar atelectasis. Hazy opacity in the left mid and lower chest may be due to pleural effusion and atelectasis.  IMPRESSION: Tip of left IJ catheter projects over the lower superior vena cava. Negative for pneumothorax.  Some worsening in aeration left mid and lower lung zones which  could be due to a small effusion and atelectasis.   Electronically Signed   By: Inge Rise M.D.   On: 10/13/2014 12:19     Medications:     Scheduled Medications: . Chlorhexidine Gluconate Cloth  6 each Topical Q0600  . digoxin  0.125 mg Oral Daily  . feeding supplement (ENSURE COMPLETE)  237 mL Oral TID BM  . heparin subcutaneous  5,000 Units Subcutaneous 3 times per day  . mupirocin ointment  1 application Nasal BID  . pantoprazole  40 mg Oral Daily  . simvastatin  40 mg Oral Daily  . spironolactone  12.5 mg Oral Daily    Infusions: . milrinone 0.125 mcg/kg/min (10/15/14 0400)  . norepinephrine (LEVOPHED) Adult infusion 6 mcg/min (10/15/14 0805)    PRN Medications: morphine injection, ondansetron (ZOFRAN) IV   Assessment:   1) Cardiogenic shock 2) A/C systolic HF  - EF 27% by bedside echo. With severe RV dysfunction.  - Reportedly normal coronaries on cath 2013 in [place]  3) COPD  4) LLL nodule  5) Unintentional weight loss  6) Acute respiratory failure  7) Cachexia  8) Iron deficient anemia 9) Hyponatremia   Plan/Discussion:    He has profound cardiogenic shock with ongoing hypotension despite dual pressors. Will repeat co-ox. If remains low will change milrinone to dobutamine 5 and try to wean levophed. Will resume lasix today.  I am also disturbed by his profound weight loss over the past 6 months. This may be from cardiac cachexia but seems quite dramatic. He tells me that he often went hungry in [place] and didn't eat well. HIV and hepatitis serologies are negative. TSH is normal. He has a small lung nodule but no clear evidence of an advanced oncologic process. However he does have an iron def anemia and colon CA must be considered. He received most of his care at [facility] and [facility] in [place] and I have asked to obtain old records from these facilities. Will request dietary consult.  HGb now stabilized. I  gave him Feraheme yesterday.   Can probably transfer to [facility] stepdown.   The patient is critically ill with multiple organ systems failure and requires high complexity decision making for assessment and support, frequent evaluation and titration of therapies, application of advanced monitoring technologies and extensive interpretation of multiple databases.   Critical Care Time devoted to patient care services described in this note is 35 Minutes.  Length of Stay: 3 Glori Bickers MD 10/15/2014, 10:28 AM Advanced Heart Failure Team Pager [phone] (M-F; [place])  Please contact [facility] for night-coverage after hours (4p -7a ) and weekends on [email or web address]

## 2014-10-16 DIAGNOSIS — E43 Unspecified severe protein-calorie malnutrition: Secondary | ICD-10-CM

## 2014-10-16 LAB — CBC
HCT: 31.7 % — ABNORMAL LOW (ref 39.0–52.0)
Hemoglobin: 10.2 g/dL — ABNORMAL LOW (ref 13.0–17.0)
MCH: 23.3 pg — ABNORMAL LOW (ref 26.0–34.0)
MCHC: 32.2 g/dL (ref 30.0–36.0)
MCV: 72.5 fL — ABNORMAL LOW (ref 78.0–100.0)
PLATELETS: 249 10*3/uL (ref 150–400)
RBC: 4.37 MIL/uL (ref 4.22–5.81)
RDW: 19.6 % — AB (ref 11.5–15.5)
WBC: 9.5 10*3/uL (ref 4.0–10.5)

## 2014-10-16 LAB — CARBOXYHEMOGLOBIN
Carboxyhemoglobin: 1.4 % (ref 0.5–1.5)
Methemoglobin: 1.6 % — ABNORMAL HIGH (ref 0.0–1.5)
O2 SAT: 53.6 %
Total hemoglobin: 10.4 g/dL — ABNORMAL LOW (ref 13.5–18.0)

## 2014-10-16 LAB — BASIC METABOLIC PANEL
Anion gap: 13 (ref 5–15)
BUN: 18 mg/dL (ref 6–23)
CALCIUM: 9 mg/dL (ref 8.4–10.5)
CO2: 25 mEq/L (ref 19–32)
Chloride: 94 mEq/L — ABNORMAL LOW (ref 96–112)
Creatinine, Ser: 0.83 mg/dL (ref 0.50–1.35)
Glucose, Bld: 93 mg/dL (ref 70–99)
POTASSIUM: 4 meq/L (ref 3.7–5.3)
SODIUM: 132 meq/L — AB (ref 137–147)

## 2014-10-16 LAB — MAGNESIUM: Magnesium: 1.9 mg/dL (ref 1.5–2.5)

## 2014-10-16 NOTE — Progress Notes (Signed)
[facility] Admission History and Physical   Patient name: Leroy Kennedy Medical record number: [id number] Date of birth: 1951/12/27 Age: 63 y.o. Gender: male  Primary Care Provider: No PCP Per Patient - Unassigned admission Consultants: PCCM and CHF/cardiology  Code Status: DNR per discussion with patient  Chief Complaint: SOB/cough/sternal CP  Assessment and Plan: [name] is a 63 y.o. male presenting with a cc of SOB/cough, and sternal CP . PMH is significant for CHF with subjectively reported EF of 35%, COPD, Depression, and back pain.  Most likely d/t CHF exacerbation.  1) Dyspnea/Hypotension 2/2 CHF exacerbation with cardiogenic shock  - Primary management per cardiology, appreciate recommendations and management along with PCCM care.   - Continues to be on Levophed this AM at 18ml/hr.  As can transition off, will assume care of pt. - Milrinone and Dobutamine per cardiology/CHF team - F/U sputum Cx and CBC.  Would consider more detailed travel hx and exposure to abnormal fungus/bacteria with recent travel if continues to have SOB despite proper diuresis   2) COPD Chronic, stable, does not appear exacerbated at this time. CXR appears hyperinflated. - Continue home meds of Advair and Albuterol PRN - Monitor O2 sats  - Start nicotine patch  3 Back Pain d/t slipped disc Chronic. On norco at home.  - Continue norco PRN pain, extended from q6prn to q8prn. - Monitor pain level  4 Tobacco abuse - Nicotine patch  5 Weight loss Reports 40 lb weight loss over the past few months. Would greatly benefit from workup of this including age-appropriate cancer screening.  - Needs PCP in [place]. On D/C, will provide with list of options.  6)rolonged QT Seen on EKG, no prior to compare. - Avoid prolongation medications if plausible   FEN/GI: ADAT Prophylaxis: SCD due to reported bloody stool.    Disposition: Remain in ICU pending transition from  Levophed   Subjective: Feeling ok.  No complaints   Objective: BP 66/47  Pulse 92  Temp(Src) 97.5 F (36.4 C) (Oral)  Resp 20  Ht 5\' 11"  (1.803 m)  Wt 129 lb 10.1 oz (58.8 kg)  BMI 18.09 kg/m2  SpO2 93% Exam: HEENT: AT/[place], o/p clear Cardiovascular: RRR, no murmurs. Regular rhythm. Respiratory: CTAB except bases with moderate crackles bilaterally; dull to percussion bilateral bases; hyperinflated Abdomen: S/NT/ND Extremities: No LE edema or calf tenderness Skin: Arms with nodules scabbed and spaced intermittently   Neuro: Awake, alert, normal speech, Grossly nl   PGY-3 MCFM  Kaida Games R. Awanda Mink, DO of [facility] 10/16/2014, 8:54 AM

## 2014-10-16 NOTE — Progress Notes (Signed)
Patient ID: Leroy Kennedy, male   DOB: 02/16/1951, 63 y.o.   MRN: [id number] Advanced Heart Failure Rounding Note   Subjective:    Leroy Kennedy is a 63 yo male with a history of chronic systolic HF, COPD and tobacco abuse.   Underwent cardiac cath in 2013 in [place] after EF found to be 35%. Says cath showed normal arteries.   Moved from [place] to [place] about a week ago. Admitted with cardiogenic shock and 40 pound weight loss.   Echo with EF 20-25%, anteroseptal akinesis, moderate MR, RV dysfunction.  Co-ox 43% Started on milrinone and IV lasix.    Today he is on dobutamine 5 and norepinephrine 4.  Very slow norepi titration down.  Co-ox 54% which is actually an improvement.  CVP 7 this morning.    CT Chest: negative for PE, 19 mm mass in LLL concerning for neoplasm Bilateral LE doppler: no DVT   Objective:   Weight Range:  Vital Signs:   Temp:  [97.6 F (36.4 C)-98.4 F (36.9 C)] 97.6 F (36.4 C) (10/18 0354) Pulse Rate:  [84-126] 92 (10/18 0715) Resp:  [12-31] 20 (10/18 0715) BP: (51-125)/(35-100) 66/47 mmHg (10/18 0715) SpO2:  [89 %-100 %] 93 % (10/18 0715) Weight:  [129 lb 10.1 oz (58.8 kg)] 129 lb 10.1 oz (58.8 kg) (10/18 0400) Last BM Date: 10/15/14  Weight change: Filed Weights   10/14/14 0500 10/15/14 0500 10/16/14 0400  Weight: 132 lb 0.9 oz (59.9 kg) 130 lb 15.3 oz (59.4 kg) 129 lb 10.1 oz (58.8 kg)    Intake/Output:   Intake/Output Summary (Last 24 hours) at 10/16/14 0800 Last data filed at 10/16/14 0700  Gross per 24 hour  Intake 1249.62 ml  Output   2825 ml  Net -1575.38 ml     Physical Exam: CVP 7 General: Chronically appearing. dyspneic with conversation  HEENT: normal except for poor dentition and temporal wasting  Neck: supple. JVP 8 cm. Carotids 2+ bilat; no bruits. No lymphadenopathy or thryomegaly appreciated.  Cor: PMI laterally displaced. RRR. +s3. 2/6 MR  Lungs: clear, diminished in the bases  Abdomen: soft, nontender, nondistended. No  hepatosplenomegaly. No bruits or masses. Good bowel sounds.  Extremities: no cyanosis, clubbing, rash, edema  Neuro: alert & orientedx3, cranial nerves grossly intact. moves all 4 extremities w/o difficulty. Affect pleasant  Telemetry: SR 80-90s  Labs: Basic Metabolic Panel:  Recent Labs Lab 10/12/14 1316 10/13/14 0049 10/14/14 0500 10/15/14 0500 10/16/14 0409  NA 139 137 134* 130* 132*  K 4.6 4.5 4.0 4.5 4.0  CL 103 100 98 95* 94*  CO2 20 21 23 23 25   GLUCOSE 79 112* 82 89 93  BUN 26* 28* 26* 18 18  CREATININE 0.97 1.17 0.98 0.82 0.83  CALCIUM 9.1 9.2 8.5 8.9 9.0  MG  --   --   --   --  1.9    Liver Function Tests:  Recent Labs Lab 10/12/14 1958  AST 19  ALT 19  ALKPHOS 113  BILITOT 0.4  PROT 7.4  ALBUMIN 3.1*    Recent Labs Lab 10/12/14 1958  LIPASE 15   No results found for this basename: AMMONIA,  in the last 168 hours  CBC:  Recent Labs Lab 10/12/14 1316 10/13/14 0049 10/14/14 0500 10/15/14 0500 10/16/14 0514  WBC 9.9 9.3 8.5 9.0 9.5  HGB 10.4* 11.3* 9.6* 9.9* 10.2*  HCT 33.1* 35.0* 30.4* 31.1* 31.7*  MCV 73.9* 73.1* 74.7* 74.9* 72.5*  PLT 294 362 291 277 249  Cardiac Enzymes:  Recent Labs Lab 10/12/14 1958 10/13/14 0049  TROPONINI <0.30 <0.30    BNP: BNP (last 3 results)  Recent Labs  10/12/14 1316  PROBNP 20243.0*     Other results:    Imaging: No results found.   Medications:     Scheduled Medications: . Chlorhexidine Gluconate Cloth  6 each Topical Q0600  . digoxin  0.125 mg Oral Daily  . enoxaparin (LOVENOX) injection  40 mg Subcutaneous Q24H  . feeding supplement (ENSURE COMPLETE)  237 mL Oral TID BM  . furosemide  40 mg Oral BID  . mupirocin ointment  1 application Nasal BID  . pantoprazole  40 mg Oral Daily  . spironolactone  12.5 mg Oral Daily    Infusions: . DOBUTamine 5 mcg/kg/min (10/16/14 0600)  . norepinephrine (LEVOPHED) Adult infusion 4 mcg/min (10/16/14 0615)    PRN  Medications: morphine injection, ondansetron (ZOFRAN) IV   Assessment:   1) Cardiogenic shock 2) A/C systolic HF  - EF 29-24% with anteroseptal akinesis and RV dysfunction, moderate MR.   - Reportedly normal coronaries on cath 2013 in [place]  3) COPD  4) LLL mass, ?neoplasm 5) Unintentional weight loss  6) Acute respiratory failure  7) Cachexia  8) Iron deficient anemia 9) Hyponatremia   Plan/Discussion:    He has cardiogenic shock.  Some improvement with norepinephrine + dobutamine.  Co-ox 54% but this is improved.  Will continue very slow wean downwards of norepi today.  Gentle Lasix to keep O>I, CVP lower today.  Creatinine stable so far.   With regionality of wall motion abnormalities, he should have coronary angiography at some point when more stable.   I am also disturbed by his profound weight loss over the past 6 months. This may be from cardiac cachexia but seems quite dramatic. He tells me that he often went hungry in [place] and didn't eat well. HIV and hepatitis serologies are negative. TSH is normal. He has a lung mass LLL concerning for neoplasm.  Also he has iron def anemia and colon CA must be considered. He received most of his care at [facility] and [facility] in [place] and I have asked to obtain old records from these facilities. Will request dietary consult. Will need workup of lung mass with PET-CT when more stable.   Hemoglobin stable.    The patient is critically ill with multiple organ systems failure and requires high complexity decision making for assessment and support, frequent evaluation and titration of therapies, application of advanced monitoring technologies and extensive interpretation of multiple databases.   Length of Stay: 4 Loralie Champagne MD 10/16/2014, 8:00 AM Advanced Heart Failure Team Pager [phone] (M-F; 7a - 4p)  Please contact [facility] for night-coverage after hours (4p -7a ) and weekends on  [email or web address]

## 2014-10-17 ENCOUNTER — Inpatient Hospital Stay (HOSPITAL_COMMUNITY): Payer: Medicaid Other

## 2014-10-17 LAB — CBC WITH DIFFERENTIAL/PLATELET
BASOS ABS: 0 10*3/uL (ref 0.0–0.1)
Basophils Relative: 0 % (ref 0–1)
Eosinophils Absolute: 0.3 10*3/uL (ref 0.0–0.7)
Eosinophils Relative: 3 % (ref 0–5)
HEMATOCRIT: 34.9 % — AB (ref 39.0–52.0)
Hemoglobin: 11.3 g/dL — ABNORMAL LOW (ref 13.0–17.0)
LYMPHS ABS: 1 10*3/uL (ref 0.7–4.0)
LYMPHS PCT: 11 % — AB (ref 12–46)
MCH: 23.6 pg — ABNORMAL LOW (ref 26.0–34.0)
MCHC: 32.4 g/dL (ref 30.0–36.0)
MCV: 73 fL — AB (ref 78.0–100.0)
MONO ABS: 0.6 10*3/uL (ref 0.1–1.0)
Monocytes Relative: 6 % (ref 3–12)
Neutro Abs: 7.5 10*3/uL (ref 1.7–7.7)
Neutrophils Relative %: 80 % — ABNORMAL HIGH (ref 43–77)
PLATELETS: 302 10*3/uL (ref 150–400)
RBC: 4.78 MIL/uL (ref 4.22–5.81)
RDW: 20.5 % — AB (ref 11.5–15.5)
WBC: 9.4 10*3/uL (ref 4.0–10.5)

## 2014-10-17 LAB — CARBOXYHEMOGLOBIN
CARBOXYHEMOGLOBIN: 1.2 % (ref 0.5–1.5)
Carboxyhemoglobin: 1.9 % — ABNORMAL HIGH (ref 0.5–1.5)
Methemoglobin: 1.3 % (ref 0.0–1.5)
Methemoglobin: 0.9 % (ref 0.0–1.5)
O2 SAT: 60.5 %
O2 Saturation: 43.3 %
TOTAL HEMOGLOBIN: 11.6 g/dL — AB (ref 13.5–18.0)
Total hemoglobin: 11.7 g/dL — ABNORMAL LOW (ref 13.5–18.0)

## 2014-10-17 LAB — CBC
HEMATOCRIT: 34.9 % — AB (ref 39.0–52.0)
HEMOGLOBIN: 11.1 g/dL — AB (ref 13.0–17.0)
MCH: 23.3 pg — ABNORMAL LOW (ref 26.0–34.0)
MCHC: 31.8 g/dL (ref 30.0–36.0)
MCV: 73.3 fL — ABNORMAL LOW (ref 78.0–100.0)
Platelets: 276 10*3/uL (ref 150–400)
RBC: 4.76 MIL/uL (ref 4.22–5.81)
RDW: 20.3 % — ABNORMAL HIGH (ref 11.5–15.5)
WBC: 7.3 10*3/uL (ref 4.0–10.5)

## 2014-10-17 LAB — BASIC METABOLIC PANEL
Anion gap: 13 (ref 5–15)
BUN: 20 mg/dL (ref 6–23)
CHLORIDE: 96 meq/L (ref 96–112)
CO2: 27 meq/L (ref 19–32)
Calcium: 9.5 mg/dL (ref 8.4–10.5)
Creatinine, Ser: 0.84 mg/dL (ref 0.50–1.35)
GFR calc Af Amer: >90 mL/min (ref >90)
GFR calc non Af Amer: >90 mL/min (ref >90)
GLUCOSE: 74 mg/dL (ref 70–99)
POTASSIUM: 4.1 meq/L (ref 3.7–5.3)
SODIUM: 136 meq/L — AB (ref 137–147)

## 2014-10-17 LAB — PROTEIN ELECTROPHORESIS, SERUM
ALPHA-1-GLOBULIN: 10.2 % — AB (ref 2.9–4.9)
ALPHA-2-GLOBULIN: 16.8 % — AB (ref 7.1–11.8)
Albumin ELP: 44.8 % — ABNORMAL LOW (ref 55.8–66.1)
BETA 2: 5.6 % (ref 3.2–6.5)
BETA GLOBULIN: 7.5 % — AB (ref 4.7–7.2)
GAMMA GLOBULIN: 15.1 % (ref 11.1–18.8)
M-Spike, %: NOT DETECTED g/dL
Total Protein ELP: 6.5 g/dL (ref 6.0–8.3)

## 2014-10-17 MED ORDER — SODIUM CHLORIDE 0.9 % IV SOLN
INTRAVENOUS | Status: DC
  Administered 2014-10-17: 20 mL/h via INTRAVENOUS
  Administered 2014-10-18 – 2014-10-28 (×7): via INTRAVENOUS

## 2014-10-17 MED ORDER — MAGNESIUM SULFATE 40 MG/ML IJ SOLN
2.0000 g | Freq: Once | INTRAMUSCULAR | Status: AC
  Administered 2014-10-17: 2 g via INTRAVENOUS
  Filled 2014-10-17: qty 50

## 2014-10-17 MED ORDER — SODIUM CHLORIDE 0.9 % IV BOLUS (SEPSIS)
250.0000 mL | Freq: Once | INTRAVENOUS | Status: AC
  Administered 2014-10-17: 250 mL via INTRAVENOUS

## 2014-10-17 MED ORDER — LIDOCAINE 5 % EX PTCH
1.0000 | MEDICATED_PATCH | CUTANEOUS | Status: DC
  Administered 2014-10-17 – 2014-10-27 (×7): 1 via TRANSDERMAL
  Filled 2014-10-17 (×12): qty 1

## 2014-10-17 MED ORDER — HYDROCODONE-ACETAMINOPHEN 5-325 MG PO TABS
1.0000 | ORAL_TABLET | ORAL | Status: DC | PRN
  Administered 2014-10-17: 1 via ORAL
  Administered 2014-10-17: 2 via ORAL
  Administered 2014-10-17: 1 via ORAL
  Administered 2014-10-18 – 2014-10-24 (×22): 2 via ORAL
  Filled 2014-10-17 (×10): qty 2
  Filled 2014-10-17: qty 1
  Filled 2014-10-17 (×9): qty 2
  Filled 2014-10-17: qty 1
  Filled 2014-10-17 (×5): qty 2

## 2014-10-17 NOTE — Plan of Care (Signed)
Problem: Phase II Progression Outcomes Goal: Pain controlled Outcome: Not Progressing Orders changed to norco 1-2 po.

## 2014-10-17 NOTE — Progress Notes (Signed)
Patient ID: Leroy Kennedy, male   DOB: [birth year], 63 y.o.   MRN: [id number] Advanced Heart Failure Rounding Note   Subjective:    Leroy Kennedy is a 63 yo male with a history of chronic systolic HF, COPD and tobacco abuse.   Underwent cardiac cath in 2013 in [place] after EF found to be 35%. Says cath showed normal arteries.   Moved from [place] to [place] about a week ago. Admitted with cardiogenic shock and 40 pound weight loss.   Echo with EF 20-25%, anteroseptal akinesis, moderate MR, RV dysfunction.  Co-ox 43% Started on milrinone and IV lasix.    Over the weekend he was switched off milrinone and started on dobutamine and norepinephrine.  Yesterday norepinephrine was weaned off. This am norepi  Restarted due to SBP 70. CO-OX back down to 43%. Weight down 3 pounds. CVP 3-4  SOB at rest. Denies orthopnea. + back pain   CT Chest: negative for PE, 19 mm mass in LLL concerning for neoplasm Bilateral LE doppler: no DVT   Objective:   Weight Range:  Vital Signs:   Temp:  [97.4 F (36.3 C)-97.7 F (36.5 C)] 97.5 F (36.4 C) (10/19 0806) Pulse Rate:  [60-106] 81 (10/19 0730) Resp:  [12-27] 16 (10/19 0730) BP: (62-110)/(41-73) 91/66 mmHg (10/19 0730) SpO2:  [81 %-100 %] 99 % (10/19 0730) Weight:  [126 lb 8.7 oz (57.4 kg)] 126 lb 8.7 oz (57.4 kg) (10/19 0530) Last BM Date: 10/16/14  Weight change: Filed Weights   10/15/14 0500 10/16/14 0400 10/17/14 0530  Weight: 130 lb 15.3 oz (59.4 kg) 129 lb 10.1 oz (58.8 kg) 126 lb 8.7 oz (57.4 kg)    Intake/Output:   Intake/Output Summary (Last 24 hours) at 10/17/14 0851 Last data filed at 10/17/14 0600  Gross per 24 hour  Intake 628.56 ml  Output   2600 ml  Net -1971.44 ml     Physical Exam: CVP 3-4 General: Chronically appearing.  Dyspneic talking  HEENT: normal except for poor dentition and temporal wasting  Neck: supple. JVP flat  Carotids 2+ bilat; no bruits. No lymphadenopathy or thryomegaly appreciated.  Cor: PMI laterally  displaced. RRR. +s3. 2/6 MR  Lungs: clear, diminished in the bases  Abdomen: soft, nontender, nondistended. No hepatosplenomegaly. No bruits or masses. Good bowel sounds.  Extremities: no cyanosis, clubbing, rash, edema R and LLE SCDs on  Neuro: alert & orientedx3, cranial nerves grossly intact. moves all 4 extremities w/o difficulty. Affect pleasant  Telemetry: SR 80-90s  Labs: Basic Metabolic Panel:  Recent Labs Lab 10/13/14 0049 10/14/14 0500 10/15/14 0500 10/16/14 0409 10/17/14 0430  NA 137 134* 130* 132* 136*  K 4.5 4.0 4.5 4.0 4.1  CL 100 98 95* 94* 96  CO2 21 23 23 25 27   GLUCOSE 112* 82 89 93 74  BUN 28* 26* 18 18 20   CREATININE 1.17 0.98 0.82 0.83 0.84  CALCIUM 9.2 8.5 8.9 9.0 9.5  MG  --   --   --  1.9  --     Liver Function Tests:  Recent Labs Lab 10/12/14 1958  AST 19  ALT 19  ALKPHOS 113  BILITOT 0.4  PROT 7.4  ALBUMIN 3.1*    Recent Labs Lab 10/12/14 1958  LIPASE 15   No results found for this basename: AMMONIA,  in the last 168 hours  CBC:  Recent Labs Lab 10/13/14 0049 10/14/14 0500 10/15/14 0500 10/16/14 0514 10/17/14 0430  WBC 9.3 8.5 9.0 9.5 7.3  HGB 11.3*  9.6* 9.9* 10.2* 11.1*  HCT 35.0* 30.4* 31.1* 31.7* 34.9*  MCV 73.1* 74.7* 74.9* 72.5* 73.3*  PLT 362 291 277 249 276    Cardiac Enzymes:  Recent Labs Lab 10/12/14 1958 10/13/14 0049  TROPONINI <0.30 <0.30    BNP: BNP (last 3 results)  Recent Labs  10/12/14 1316  PROBNP 20243.0*     Other results:    Imaging: Dg Chest Port 1 View  10/17/2014   CLINICAL DATA:  Congestive heart failure  EXAM: PORTABLE CHEST - 1 VIEW  COMPARISON:  10/13/2014  FINDINGS: Cardiomediastinal silhouette is stable. Again noted elevation of the right hemidiaphragm. There is blunting of the right costophrenic angle probable due to small right pleural effusion. No segmental infiltrate or pulmonary edema. Left IJ central line in place with tip in SVC.  IMPRESSION: Probable small right  pleural effusion with blunting of the right costophrenic angle. No segmental infiltrate or pulmonary edema. Left IJ central line in place.   Electronically Signed   By: Lahoma Crocker M.D.   On: 10/17/2014 08:24     Medications:     Scheduled Medications: . Chlorhexidine Gluconate Cloth  6 each Topical Q0600  . digoxin  0.125 mg Oral Daily  . enoxaparin (LOVENOX) injection  40 mg Subcutaneous Q24H  . feeding supplement (ENSURE COMPLETE)  237 mL Oral TID BM  . furosemide  40 mg Oral BID  . mupirocin ointment  1 application Nasal BID  . pantoprazole  40 mg Oral Daily  . spironolactone  12.5 mg Oral Daily    Infusions: . DOBUTamine 5 mcg/kg/min (10/17/14 0600)  . norepinephrine (LEVOPHED) Adult infusion Stopped (10/17/14 0317)    PRN Medications: morphine injection, ondansetron (ZOFRAN) IV   Assessment:   1) Cardiogenic shock 2) A/C systolic HF  - EF 25-36% with anteroseptal akinesis and RV dysfunction, moderate MR.   - Reportedly normal coronaries on cath 2013 in [place]  3) COPD  4) LLL mass, ?neoplasm 5) Unintentional weight loss  6) Acute respiratory failure  7) Cachexia  8) Iron deficient anemia 9) Hyponatremia   Plan/Discussion:    He has cardiogenic shock. HIV and hepatitis serologies are negative. TSH is normal.   Today CO-OX down to 43% off  Norepinephrine and he has increased dyspnea at rest.  He remains on dobutamine and has been hypotensive. Will need to restart norepi . Repeat CO-OX at 1200. Volume status low. CVP 1-2. Stop lasix for now. Reanl function ok. Continue digoxin.   With regionality of wall motion abnormalities, he should have coronary angiography at some point when more stable.   LLL ? Lung mass. Will need workup of lung mass with PET-CT when more stable.   Hemoglobin stable.  He has Iron Def Anemia will need to consider colon cancer. Will need GI at some point.    Length of Stay: 5 CLEGG,AMY NP-C 10/17/2014, 8:51 AM Advanced Heart Failure  Team Pager [phone] (M-F; [place])  Please contact [facility] for night-coverage after hours (4p -7a ) and weekends on [email or web address]  Patient seen and examined with Darrick Grinder, NP. We discussed all aspects of the encounter. I agree with the assessment and plan as stated above.   He has persistent cardiogenic shock despite inotropic support. I had a long talk with him regarding his situation. He has just moved from [place] and has very limited family and social support. He is not candidate for mechanical support. We will continue to try to get him on single agent  inotrope (dobutamine) but I think this may be very difficult. We will give him 250 cc NS to get CVP 6-8 range. Continue digoxin. Hold lasix.  Will have PT see. Can go to SDU.   Agree with plan for cath at some point but this is not the most pressing issue currently.   The patient is critically ill with multiple organ systems failure and requires high complexity decision making for assessment and support, frequent evaluation and titration of therapies, application of advanced monitoring technologies and extensive interpretation of multiple databases.   Critical Care Time devoted to patient care services described in this note is 35 Minutes.  Daniel Bensimhon,MD 10:41 AM

## 2014-10-17 NOTE — Plan of Care (Signed)
Problem: Consults Goal: Heart Failure Patient Education (See Patient Education module for education specifics.)  Outcome: Progressing Book Provided

## 2014-10-17 NOTE — Progress Notes (Signed)
I discussed with  Dr Lonny Prude & MS Shelly Flatten.  I agree with their plans documented in their Progress notes.

## 2014-10-17 NOTE — Progress Notes (Signed)
Family Medicine Teaching Service Daily Progress Note Med Student Pager: [phone]  Patient name: Leroy Kennedy Medical record number: [id number] Date of birth: 08/21/1951 Age: 63 y.o. Gender: male  Primary Care Provider: No PCP Per Patient Consultants: Cardiology Code Status: Full Code  Pt Overview and Major Events to Date:  Patient was seen this AM.  No new complaints or questions.  Patient did come off of Levophed and is now on 4.5 of Dobutamine.  Assessment and Plan: Leroy Kennedy is a 63 y.o. male presenting with a cc of SOB/cough, and reproducible sternal CP . PMH is significant for CHF with EF of 15%, COPD, Depression, and back pain. Most likely d/t CHF exacerbation.  1) Dyspnea/Hypotension 2/2 CHF exacerbation with cardiogenic shock. - Cardiology is following and appreciate recommendations and management along. - D/c Levophed this AM. - Dobutamine per cardiology.  Currently on 4.5. - F/U sputum culture and subsequent treatment recommendations based on these results. - Continue to monitor BP. - Will continue Lasix 40mg  BID  2) COPD  - Chronic, stable,  - CXR appears hyperinflated.  - Continue home meds of Advair and Albuterol - Monitor O2 sats  - Continue Nicotine patch  3) Weight loss  - Reports 40 lb weight loss over the past few months.  - Possibly d/t pulmonary nodule discovered. - Needs PCP in [place]. On D/C, will provide with list of options.  - Will recommend close follow up of this nodule as an outpatient.  4) Back Pain d/t slipped disc  - Chronic. On norco at home.  - Continue norco PRN pain, extended from q6prn to q8prn.  - Monitor pain level  FEN/GI: ADAT PPx: SCD due to possible bloody stool/unknown etiology of abdominal pain.  Disposition: Remains in ICU however care has been transferred to [facility].  Off Levophed.    Subjective:  Patient was seen this AM.  No complaints or concerns.  BP has improved some and is now off Levophed.  Still remains  extremely sick and will need close monitoring by all teams involved.    Objective: Temp:  [97.5 F (36.4 C)-97.8 F (36.6 C)] 97.8 F (36.6 C) (10/19 1243) Pulse Rate:  [60-106] 101 (10/19 1200) Resp:  [12-27] 22 (10/19 1200) BP: (62-103)/(41-80) 89/66 mmHg (10/19 1200) SpO2:  [81 %-100 %] 100 % (10/19 1200) Weight:  [126 lb 8.7 oz (57.4 kg)] 126 lb 8.7 oz (57.4 kg) (10/19 0530) Physical Exam: General: NAD AOx3.  Patient was laying down in bed. Cardiovascular: RRR with no murmurs, gallops, or rubs auscultated. Respiratory: CTAB with resolution of crackles.  No increased work of breathing noted.  Bilaterally breath sounds auscultated.  Abdomen: Soft and non-tender to palpation.  No guarding or rebound tenderness.  Bowel sounds auscultated. Extremities: No edema or calf tenderness.  Laboratory:  Recent Labs Lab 10/16/14 0514 10/17/14 0430 10/17/14 1000  WBC 9.5 7.3 9.4  HGB 10.2* 11.1* 11.3*  HCT 31.7* 34.9* 34.9*  PLT 249 276 302    Recent Labs Lab 10/12/14 1316 10/12/14 1958  10/15/14 0500 10/16/14 0409 10/17/14 0430  NA 139  --   < > 130* 132* 136*  K 4.6  --   < > 4.5 4.0 4.1  CL 103  --   < > 95* 94* 96  CO2 20  --   < > 23 25 27   BUN 26*  --   < > 18 18 20   CREATININE 0.97  --   < > 0.82 0.83 0.84  CALCIUM  9.1  --   < > 8.9 9.0 9.5  PROT  --  7.4  --   --   --   --   BILITOT  --  0.4  --   --   --   --   ALKPHOS  --  113  --   --   --   --   ALT  --  19  --   --   --   --   AST  --  19  --   --   --   --   GLUCOSE 79  --   < > 89 93 74  < > = values in this interval not displayed.   Imaging/Diagnostic Tests: - Chest x-ray: Probable small right pleural effusion with blunting of the right  costophrenic angle. No segmental infiltrate or pulmonary edema. Left  IJ central line in place.   Doroteo Bradford, Med Student 10/17/2014, 12:51 PM  Upper Level Addendum I have seen and examined the patient. I have read and agree with the above note. My changes  are noted in blue.  S: Patient complains of chest pain that has been intermittent. It is substernal and relieved by morphine. Worse when coughing. No other complaints.  O: Gen: chronically ill appearing, but looks fair Respiratory: clear to auscultation bilaterally, no wheezing or rales Cardiovascular: RRR, no murmur Chest: tender to palpation (reproducible pain) Abdomen: soft, non-tender, non-distended Extremities: SCDs in place. 2+ DP pulsess  # Hypotension/dyspnea: appears to be secondary to CHF exacerbation and cardiogenic shock. Patient has been on milronone and levophed; now on dobutamine drip. Pressures continue to be on soft side, but improved since off of pressors. Currently asymptomatic with mild reproducible chest pain. Following cardiology recommendations. Will continue to monitor blood pressure. Lasix discontinued earlier today which may help with low pressures.  # COPD: chronic and stable. No wheezes on exam. Will continue home meds. Monitor O2.  # Weight loss: acute and unintentional. Pulmonary nodule on recent imaging. Patient new to [facility] and will need a PCP to follow up outpatient. Patient is a smoker.  # Back pain: this is chronic. Due to slipped disc. Will continue norco PRN for pain   Cordelia Poche, MD PGY-2, [facility] 10/17/2014, 2:13 PM

## 2014-10-17 NOTE — Plan of Care (Signed)
Problem: Phase III Progression Outcomes Goal: Dyspnea controlled with activity Outcome: Progressing Off Bipap

## 2014-10-17 NOTE — Progress Notes (Signed)
PULMONARY / CRITICAL CARE MEDICINE   Name: Leroy Kennedy MRN: [id number] DOB: 12/09/1951    ADMISSION DATE:  10/12/2014 CONSULTATION DATE:  10/13/2014  REFERRING MD :  Dr Mingo Amber  CHIEF COMPLAINT:  SOB  INITIAL PRESENTATION: 63 year old male admitted 10/14 for presumed AonC CHF and treated with diuresis. 10/15 early AM he became acutely more dyspneic and hypotensive with CP. Transferred to ICU. PCCM asked to take over care.    STUDIES:  CTA chest 10/15  >>> 19 mm nodule LLL, chf, no pe BLE doppler 10/15>>> NEG DVT 10/17 EF 20-25%, severe hypokinesis in anteroseptal   SIGNIFICANT EVENTS: 10/14 admitted for CHF exacerbation 10/15 transferred to ICU with CP, SOB, distress 10/16- milrinone, remains in shock on neo 10/18 Hypotensive on dobutamine 10/19 transfer to [facility] with cardiology to manage pressors   SUBJECTIVE/ OVERNIGHT: persistent shock requiring inotropic support  VITAL SIGNS: Temp:  [97.4 F (36.3 C)-97.7 F (36.5 C)] 97.5 F (36.4 C) (10/19 0404) Pulse Rate:  [60-106] 102 (10/19 0645) Resp:  [12-27] 17 (10/19 0645) BP: (62-110)/(41-91) 74/51 mmHg (10/19 0645) SpO2:  [81 %-100 %] 100 % (10/19 0645) Weight:  [126 lb 8.7 oz (57.4 kg)] 126 lb 8.7 oz (57.4 kg) (10/19 0530) HEMODYNAMICS: CVP:  [2 mmHg-5 mmHg] 2 mmHg VENTILATOR SETTINGS:   INTAKE / OUTPUT:  Intake/Output Summary (Last 24 hours) at 10/17/14 8841 Last data filed at 10/17/14 0600  Gross per 24 hour  Intake 888.06 ml  Output   3000 ml  Net -2111.94 ml    PHYSICAL EXAMINATION: General:  Thin male in NAD Neuro:  Alert, oriented, no focal deficit HEENT:  [place]/AT, PERRL, jvd noted Cardiovascular:  regular, no MRG Lungs:  Respirations even, unlabored, diminished bibasilar breath sounds  Abdomen:  Soft, diffusely tender. BS normoactive Musculoskeletal:  NO acute deformity, no edema Skin:  Intact  LABS:  CBC  Recent Labs Lab 10/15/14 0500 10/16/14 0514 10/17/14 0430  WBC 9.0 9.5 7.3  HGB 9.9*  10.2* 11.1*  HCT 31.1* 31.7* 34.9*  PLT 277 249 276   Coag's  Recent Labs Lab 10/13/14 0730  INR 1.37   BMET  Recent Labs Lab 10/15/14 0500 10/16/14 0409 10/17/14 0430  NA 130* 132* 136*  K 4.5 4.0 4.1  CL 95* 94* 96  CO2 23 25 27   BUN 18 18 20   CREATININE 0.82 0.83 0.84  GLUCOSE 89 93 74   Electrolytes  Recent Labs Lab 10/15/14 0500 10/16/14 0409 10/17/14 0430  CALCIUM 8.9 9.0 9.5  MG  --  1.9  --    Sepsis Markers  Recent Labs Lab 10/12/14 1958 10/13/14 0730 10/13/14 0950 10/14/14 0500 10/15/14 0500  LATICACIDVEN 2.0 3.7*  --   --   --   PROCALCITON  --   --  <0.10 <0.10 <0.10   ABG  Recent Labs Lab 10/13/14 0602  PHART 7.517*  PCO2ART 19.0*  PO2ART 128.0*   Liver Enzymes  Recent Labs Lab 10/12/14 1958  AST 19  ALT 19  ALKPHOS 113  BILITOT 0.4  ALBUMIN 3.1*   Cardiac Enzymes  Recent Labs Lab 10/12/14 1316 10/12/14 1958 10/13/14 0049  TROPONINI  --  <0.30 <0.30  PROBNP 20243.0*  --   --    Glucose  Recent Labs Lab 10/13/14 0432  GLUCAP 90    Imaging No results found.   ASSESSMENT / PLAN:  PULMONARY A: Acute resp failure - r/t acute on chronic CHF with underlying COPD, active smoker H/o COPD Tobacco abuse 53mm  LLL nodule? Malignancy? P:   Supplemental O2 to maintain SpO2 greater than 92%. CXR this am Diuresis per cards (will hold for today)  CARDIOVASCULAR CVL L IJ 10/15 A:  Shock, cardiogenic vs obstructive Acute on chronic CHF (EF 20-25%) H/o HTN  P:  MAP goal 55 mm/Hg Milrinone -->  dobutamine with goal CVP 6-8 Spiro 12.5 and dig 0.125mg   Diuresis per cards  F/u co-ox (43) Hold home BB  RENAL A:   At risk for cardiorenal Hyponatremia Improved renal fxn with neg balance P:   Follow Bmet Dobutamine  250cc NS bolus then KVO IVF   GASTROINTESTINAL A:   H/o GERD Weight loss - profound weight loss over 6 months P:   Continue preadmission PPI Advance diet as tol  W/u malignancy once  acute issues improved    HEMATOLOGIC A:   Possible PE/DVT, D-dimer elevated, doppler neg, low clincial suspicion Anemia 10.7 --> 11.1 P:  Doppler neg Subq heparin  SCDs CBCs as needed  INFECTIOUS A:   R/o CAP vs bronchitis Unimpressive CT Neg pct trend P:   BCx2 10/14 >>> Sputum 10/14 >>> Abx: Levaquin 10/15 >>>10/16 Trend PCT to limit abx successful  HIV, Hepatitis panel NEG  ENDOCRINE A:   R/o rel AI Cortisol 30 P:   Monitor Glucose on BMET  NEUROLOGIC A:   Chronic pain P:   PRN morphine  Hold home meds    Today's summary:transfer to [facility] with cardiology co-managing  Bernadene Bell, MD Family Medicine PGY-2 Please page or call with questions    10/17/2014  7:21 AM

## 2014-10-17 NOTE — Plan of Care (Signed)
Problem: Phase II Progression Outcomes Goal: Tolerating diet Outcome: Completed/Met Date Met:  10/17/14 Heart healthy diet

## 2014-10-18 LAB — CBC
HEMATOCRIT: 34.8 % — AB (ref 39.0–52.0)
Hemoglobin: 11.1 g/dL — ABNORMAL LOW (ref 13.0–17.0)
MCH: 23.5 pg — ABNORMAL LOW (ref 26.0–34.0)
MCHC: 31.9 g/dL (ref 30.0–36.0)
MCV: 73.7 fL — AB (ref 78.0–100.0)
PLATELETS: 303 10*3/uL (ref 150–400)
RBC: 4.72 MIL/uL (ref 4.22–5.81)
RDW: 21.1 % — ABNORMAL HIGH (ref 11.5–15.5)
WBC: 8.9 10*3/uL (ref 4.0–10.5)

## 2014-10-18 LAB — BASIC METABOLIC PANEL
ANION GAP: 13 (ref 5–15)
BUN: 23 mg/dL (ref 6–23)
CALCIUM: 8.9 mg/dL (ref 8.4–10.5)
CO2: 26 mEq/L (ref 19–32)
Chloride: 96 mEq/L (ref 96–112)
Creatinine, Ser: 0.91 mg/dL (ref 0.50–1.35)
GFR, EST NON AFRICAN AMERICAN: 88 mL/min — AB (ref >90)
Glucose, Bld: 90 mg/dL (ref 70–99)
Potassium: 4.4 mEq/L (ref 3.7–5.3)
Sodium: 135 mEq/L — ABNORMAL LOW (ref 137–147)

## 2014-10-18 LAB — CULTURE, RESPIRATORY: CULTURE: NORMAL

## 2014-10-18 LAB — CARBOXYHEMOGLOBIN
Carboxyhemoglobin: 1.8 % — ABNORMAL HIGH (ref 0.5–1.5)
Methemoglobin: 0.7 % (ref 0.0–1.5)
O2 Saturation: 55 %
Total hemoglobin: 11.1 g/dL — ABNORMAL LOW (ref 13.5–18.0)

## 2014-10-18 LAB — DIGOXIN LEVEL: Digoxin Level: 0.4 ng/mL — ABNORMAL LOW (ref 0.8–2.0)

## 2014-10-18 LAB — CULTURE, RESPIRATORY W GRAM STAIN

## 2014-10-18 MED ORDER — SODIUM CHLORIDE 0.9 % IV BOLUS (SEPSIS)
500.0000 mL | Freq: Once | INTRAVENOUS | Status: AC
  Administered 2014-10-18: 500 mL via INTRAVENOUS

## 2014-10-18 MED ORDER — DIPHENHYDRAMINE-ZINC ACETATE 2-0.1 % EX CREA
TOPICAL_CREAM | Freq: Three times a day (TID) | CUTANEOUS | Status: DC | PRN
  Administered 2014-10-22: 22:00:00 via TOPICAL
  Filled 2014-10-18: qty 28

## 2014-10-18 NOTE — Progress Notes (Signed)
Family Medicine Teaching Service Daily Progress Note Med Student Pager: [phone]  Patient name: Leroy Kennedy Medical record number: [id number] Date of birth: [birth year] Age: 63 y.o. Gender: male  Primary Care Provider: No PCP Per Patient Consultants: Cardiology Code Status: Full Code  Pt Overview and Major Events to Date:  Patient was seen this AM.  No complaints or questions.  Patient did mention some minor wheezing, however this is NOT new and self reports it actually improving from admission.    Assessment and Plan: Leroy Kennedy is a 63 yo male presenting with a cc of SOB/cough and reproducible sternal cp.  PMH of interest is CHF with EF of 15%, COPD, Depression, and back pain.  Symptoms most likely d/t CHF exacerbation with cardiogenic shock.  1) Dyspnea/Hypotension 2/2 CHF exacerbation with cardiogenic shock. BP 92/67, HR 95, O2 99 on RA. - Cardiology is following and appreciate recommendations and management along.  - Levophed being weaned today.  Down to 4 from 6.   - Dobutamine per cardiology. Currently on 4.5.  - F/U sputum culture and subsequent treatment recommendations based on these results.  - Continue to monitor BP.  - Lasix d/c per cadiology 2/2 low BP levels and few signs of continued fluid overload on exam.   - Will continue to hold ACEI and BB due to BP as well.  2) COPD  - Chronic, stable, no new changes.  - Continue home meds of Advair and Albuterol  - Monitor O2 sats  - Continue Nicotine patch  3) Weight loss  - No new changes. - Reports 40 lb weight loss over the past few months.  - Possibly d/t pulmonary nodule discovered.  - Needs PCP in [place]. On D/C, will provide with list of options.  - Will recommend close follow up of this nodule as an outpatient.  4) Back Pain d/t slipped disc  - Chronic. On norco at home.  - Continue norco PRN pain, extended from q6prn to q8prn.  - Patient reported that pain was well controlled this AM  FEN/GI: ADAT  PPx: SCD  2/2 possible blood stool and unknown etiology of abdominal pain.  Disposition:  Remains in ICU however care has been transferred to [facility] Med.  Will continue to be seen by Family and Cards. Pending cardiac improvement and Levophed wean.     Subjective:  Patient was seen this AM.  Awake and able to have a good conversation.  He reported some minor wheezing and arthritic pain, but continue to say that he was feeling better and that his pain was well controlled.  Patient was resting comfortable when seen.  Continuing to wean off Levophed down to 4 now.  Still on Dobutamine 4.5.  Still requires close monitoring by all teams involved.  We did speak about the health of his social history.  Claims he feels like he has good support from his "friend" and that she should be coming to see him today as she did yesterday.    Objective: Temp:  [97 F (36.1 C)-98.1 F (36.7 C)] 97 F (36.1 C) (10/20 0759) Pulse Rate:  [70-123] 86 (10/20 0800) Resp:  [12-38] 21 (10/20 0800) BP: (63-103)/(46-80) 88/66 mmHg (10/20 0800) SpO2:  [88 %-100 %] 96 % (10/20 0800) Weight:  [128 lb 15.5 oz (58.5 kg)] 128 lb 15.5 oz (58.5 kg) (10/20 0430) Physical Exam: General: NAD and AOx3.  Patient was laying back in bed and conversational. Cardiovascular: RRR with no murmurs, gallops or rubs auscultated. Respiratory: Some minor  wheezing auscultated on the R lung fields.  No crackles heard.  Bilateral breath sounds auscultated and no increased work of breathing noted. Abdomen: Soft with minor tenderness in the RLQ.  No guarding or rebound tenderness.  Bowel sounds auscultated. Extremities: No edema or calf tenderness noted.  Laboratory:  Recent Labs Lab 10/17/14 0430 10/17/14 1000 10/18/14 0438  WBC 7.3 9.4 8.9  HGB 11.1* 11.3* 11.1*  HCT 34.9* 34.9* 34.8*  PLT 276 302 303    Recent Labs Lab 10/12/14 1316 10/12/14 1958  10/16/14 0409 10/17/14 0430 10/18/14 0438  NA 139  --   < > 132* 136* 135*  K 4.6  --   <  > 4.0 4.1 4.4  CL 103  --   < > 94* 96 96  CO2 20  --   < > 25 27 26   BUN 26*  --   < > 18 20 23   CREATININE 0.97  --   < > 0.83 0.84 0.91  CALCIUM 9.1  --   < > 9.0 9.5 8.9  PROT  --  7.4  --   --   --   --   BILITOT  --  0.4  --   --   --   --   ALKPHOS  --  113  --   --   --   --   ALT  --  19  --   --   --   --   AST  --  19  --   --   --   --   GLUCOSE 79  --   < > 93 74 90  < > = values in this interval not displayed.   Imaging/Diagnostic Tests: Chest x-ray from 10/19: Probable small right pleural effusion with blunting of the right  costophrenic angle. No segmental infiltrate or pulmonary edema. Left  IJ central line in place.   Doroteo Bradford, Med Student 10/18/2014, 8:51 AM  Upper Level Addendum  I have seen and examined the patient. I have read and agree with the above note. My changes are noted in blue.   S: Patient still has intermittent chest pain that is worse with coughing. Has no other complaints other than small portions of food.  O:  Gen: chronically ill appearing, but looks fair  Respiratory: clear to auscultation bilaterally, no wheezing or rales Cardiovascular: RRR, no murmur  Abdomen: soft, non-tender, non-distended  Extremities: SCDs in place. 2+ DP pulsess   # Hypotension/dyspnea: appears to be secondary to CHF exacerbation and cardiogenic shock. Patient has been on milronone and levophed; was weaned off of levophed but currently back on levophed and dobutamine drip. Pressures continue to be on soft side and have worsened with attempted wean of levophed. Currently asymptomatic with mild reproducible chest pain. Following cardiology recommendations. Will continue to monitor blood pressure.   # COPD: chronic and stable. No wheezes on exam. Will continue home meds. Monitor O2.   # Weight loss: acute and unintentional. Pulmonary nodule on recent imaging. Patient new to [facility] and will need a PCP to follow up outpatient. Patient is a smoker.   #  Back pain: this is chronic. Due to slipped disc. Will continue norco PRN for pain  Cordelia Poche, MD PGY-2, [facility] 10/18/2014, 5:37 PM

## 2014-10-18 NOTE — Evaluation (Signed)
Physical Therapy Evaluation Patient Details Name: Leroy Kennedy MRN: [id number] DOB: [birth year] Today's Date: 10/18/2014   History of Present Illness  Leroy Kennedy is a 63 y.o. male presenting with a cc of SOB/cough, and sternal CP . PMH is significant for CHF with subjectively reported EF of 35%, COPD, Depression, and back pain.  Most likely d/t CHF exacerbation with cardiogenic shock. Pt just off of pressors with continued hypotension  Clinical Impression  Pt with decreased activity tolerance and mobility currently due to cardiopulmonary status. Pt educated for HEP and basic transfers but unable to perform further mobility this date. Pt in recliner with legs elevated and RN present throughout. Pt feeling weak throughout but no change in symptoms with mobility. Pt will require assist acutely for mobility and safety and need friend to confirm available assist at D/C. Pt will benefit from acute therapy to maximize mobility, function and safety with gait to decrease burden of care.     Follow Up Recommendations Supervision for mobility/OOB;SNF (SNF unless friend able to assist at home, friend not present to confirm)    Equipment Recommendations  None recommended by PT    Recommendations for Other Services OT consult     Precautions / Restrictions Precautions Precautions: Fall Precaution Comments: watch BP      Mobility  Bed Mobility Overal bed mobility: Needs Assistance Bed Mobility: Supine to Sit     Supine to sit: Supervision     General bed mobility comments: cues for use of rail and safety  Transfers Overall transfer level: Needs assistance   Transfers: Sit to/from Stand;Stand Pivot Transfers Sit to Stand: Min guard Stand pivot transfers: Min guard       General transfer comment: guarding for safety secondary to hypotension  Ambulation/Gait Ambulation/Gait assistance:  (unable to try today due to hypotension)              Stairs            Wheelchair  Mobility    Modified Rankin (Stroke Patients Only)       Balance Overall balance assessment: Needs assistance   Sitting balance-Leahy Scale: Good       Standing balance-Leahy Scale: Fair                               Pertinent Vitals/Pain Pain Assessment: No/denies pain HR 75 sats 97% on RA BP supine 74/57 (61) Sitting 75/53 (58) Standing 70/39 (43) Sitting with feet up 74/55 (59) Pt with map goal 55-60 with RN aware of all VS and mobility    Home Living Family/patient expects to be discharged to:: Private residence Living Arrangements: Non-relatives/Friends Available Help at Discharge: Friend(s);Available PRN/intermittently Type of Home: House Home Access: Stairs to enter   Entrance Stairs-Number of Steps: 2 Home Layout: One level Home Equipment: Walker - 2 wheels;Cane - single point      Prior Function Level of Independence: Independent               Hand Dominance        Extremity/Trunk Assessment   Upper Extremity Assessment: Generalized weakness           Lower Extremity Assessment: Generalized weakness      Cervical / Trunk Assessment: Kyphotic  Communication   Communication: No difficulties  Cognition Arousal/Alertness: Awake/alert Behavior During Therapy: WFL for tasks assessed/performed Overall Cognitive Status: Impaired/Different from baseline Area of Impairment: Safety/judgement  Safety/Judgement: Decreased awareness of deficits          General Comments      Exercises General Exercises - Lower Extremity Heel Slides: AROM;Seated;Both;15 reps Hip ABduction/ADduction: AROM;Seated;Both;15 reps      Assessment/Plan    PT Assessment Patient needs continued PT services  PT Diagnosis Difficulty walking;Generalized weakness   PT Problem List Decreased strength;Decreased activity tolerance;Decreased balance;Decreased mobility;Cardiopulmonary status limiting activity;Decreased safety awareness  PT  Treatment Interventions DME instruction;Functional mobility training;Therapeutic activities;Therapeutic exercise;Patient/family education;Gait training   PT Goals (Current goals can be found in the Care Plan section) Acute Rehab PT Goals Patient Stated Goal: return home PT Goal Formulation: With patient Time For Goal Achievement: 11/01/14 Potential to Achieve Goals: Fair    Frequency Min 3X/week   Barriers to discharge        Co-evaluation               End of Session   Activity Tolerance: Treatment limited secondary to medical complications (Comment) (hypotension) Patient left: in chair;with call bell/phone within reach;with nursing/sitter in room Nurse Communication: Mobility status;Precautions         Time: 0722-5750 PT Time Calculation (min): 21 min   Charges:   PT Evaluation $Initial PT Evaluation Tier I: 1 Procedure PT Treatments $Therapeutic Activity: 8-22 mins   PT G CodesMelford Aase 10/18/2014, 2:38 PM Elwyn Reach, [place]

## 2014-10-18 NOTE — Progress Notes (Signed)
Patient ID: Leroy Kennedy, male   DOB: 08-31-1951, 63 y.o.   MRN: [id number] Advanced Heart Failure Rounding Note   Subjective:    Leroy Kennedy is a 63 yo male with a history of chronic systolic HF, COPD and tobacco abuse.   Underwent cardiac cath in 2013 in [place] after EF found to be 35%. Says cath showed normal arteries.   Moved from [place] to [place] about a week ago. Admitted with cardiogenic shock and 40 pound weight loss.   Echo with EF 20-25%, anteroseptal akinesis, moderate MR, RV dysfunction.  Co-ox 43% Started on milrinone and IV lasix.    Over the weekend he was switched off milrinone and started on dobutamine and norepinephrine.  Given NS due to low CVP. Unable to wean norepi overnight. Now on dobut 5 norepi 6 mcg  Dyspnea better this am. Co-ox up to 61%. SBP 98.  CVP 1   CT Chest: negative for PE, 19 mm mass in LLL concerning for neoplasm Bilateral LE doppler: no DVT   Objective:   Weight Range:  Vital Signs:   Temp:  [97.5 F (36.4 C)-98.1 F (36.7 C)] 98.1 F (36.7 C) (10/20 0402) Pulse Rate:  [60-123] 97 (10/20 0500) Resp:  [13-38] 16 (10/20 0500) BP: (63-103)/(46-80) 96/66 mmHg (10/20 0500) SpO2:  [81 %-100 %] 97 % (10/20 0500) Weight:  [58.5 kg (128 lb 15.5 oz)] 58.5 kg (128 lb 15.5 oz) (10/20 0430) Last BM Date: 10/16/14  Weight change: Filed Weights   10/16/14 0400 10/17/14 0530 10/18/14 0430  Weight: 58.8 kg (129 lb 10.1 oz) 57.4 kg (126 lb 8.7 oz) 58.5 kg (128 lb 15.5 oz)    Intake/Output:   Intake/Output Summary (Last 24 hours) at 10/18/14 0545 Last data filed at 10/18/14 0500  Gross per 24 hour  Intake 2193.97 ml  Output   2010 ml  Net 183.97 ml     Physical Exam: CVP 1 General: Chronically appearing.  NAD HEENT: normal except for poor dentition and temporal wasting  Neck: supple. JVP flat  Carotids 2+ bilat; no bruits. No lymphadenopathy or thryomegaly appreciated.  Cor: PMI laterally displaced. RRR. +s3. 2/6 MR  Lungs: clear, diminished  in the bases  Abdomen: soft, nontender, nondistended. No hepatosplenomegaly. No bruits or masses. Good bowel sounds.  Extremities: no cyanosis, clubbing, rash, no edemaSCDs on  Neuro: alert & orientedx3, cranial nerves grossly intact. moves all 4 extremities w/o difficulty. Affect pleasant  Telemetry: SR 90s  Labs: Basic Metabolic Panel:  Recent Labs Lab 10/14/14 0500 10/15/14 0500 10/16/14 0409 10/17/14 0430 10/18/14 0438  NA 134* 130* 132* 136* 135*  K 4.0 4.5 4.0 4.1 4.4  CL 98 95* 94* 96 96  CO2 23 23 25 27 26   GLUCOSE 82 89 93 74 90  BUN 26* 18 18 20 23   CREATININE 0.98 0.82 0.83 0.84 0.91  CALCIUM 8.5 8.9 9.0 9.5 8.9  MG  --   --  1.9  --   --     Liver Function Tests:  Recent Labs Lab 10/12/14 1958  AST 19  ALT 19  ALKPHOS 113  BILITOT 0.4  PROT 7.4  ALBUMIN 3.1*    Recent Labs Lab 10/12/14 1958  LIPASE 15   No results found for this basename: AMMONIA,  in the last 168 hours  CBC:  Recent Labs Lab 10/15/14 0500 10/16/14 0514 10/17/14 0430 10/17/14 1000 10/18/14 0438  WBC 9.0 9.5 7.3 9.4 8.9  NEUTROABS  --   --   --  7.5  --   HGB 9.9* 10.2* 11.1* 11.3* 11.1*  HCT 31.1* 31.7* 34.9* 34.9* 34.8*  MCV 74.9* 72.5* 73.3* 73.0* 73.7*  PLT 277 249 276 302 303    Cardiac Enzymes:  Recent Labs Lab 10/12/14 1958 10/13/14 0049  TROPONINI <0.30 <0.30    BNP: BNP (last 3 results)  Recent Labs  10/12/14 1316  PROBNP 20243.0*     Other results:    Imaging: Dg Chest Port 1 View  10/17/2014   CLINICAL DATA:  Congestive heart failure  EXAM: PORTABLE CHEST - 1 VIEW  COMPARISON:  10/13/2014  FINDINGS: Cardiomediastinal silhouette is stable. Again noted elevation of the right hemidiaphragm. There is blunting of the right costophrenic angle probable due to small right pleural effusion. No segmental infiltrate or pulmonary edema. Left IJ central line in place with tip in SVC.  IMPRESSION: Probable small right pleural effusion with blunting of  the right costophrenic angle. No segmental infiltrate or pulmonary edema. Left IJ central line in place.   Electronically Signed   By: Lahoma Crocker M.D.   On: 10/17/2014 08:24     Medications:     Scheduled Medications: . Chlorhexidine Gluconate Cloth  6 each Topical Q0600  . digoxin  0.125 mg Oral Daily  . enoxaparin (LOVENOX) injection  40 mg Subcutaneous Q24H  . feeding supplement (ENSURE COMPLETE)  237 mL Oral TID BM  . lidocaine  1 patch Transdermal Q24H  . pantoprazole  40 mg Oral Daily  . spironolactone  12.5 mg Oral Daily    Infusions: . sodium chloride 20 mL/hr at 10/18/14 0424  . DOBUTamine 5 mcg/kg/min (10/17/14 2000)  . norepinephrine (LEVOPHED) Adult infusion 6 mcg/min (10/18/14 0430)    PRN Medications: HYDROcodone-acetaminophen, morphine injection, ondansetron (ZOFRAN) IV   Assessment:   1) Cardiogenic shock 2) A/C systolic HF  - EF 95-63% with anteroseptal akinesis and RV dysfunction, moderate MR.   - Reportedly normal coronaries on cath 2013 in [place]  3) COPD  4) LLL mass, ?neoplasm 5) Unintentional weight loss  6) Acute respiratory failure  7) Cachexia  8) Iron deficient anemia 9) Hyponatremia   Plan/Discussion:    Cardiac output improving but still requiring dual pressors. CVP is low. Will give 500cc NS and try to wean norepi today. Try to get CVP 6-8 range. Hold diuretics. Continue digoxin. Unable to tolerate ACE/b-blocker at this point.     I had a long talk with him regarding his situation. He has just moved from [place] and has very limited family and social support. He is not candidate for mechanical support. Will have PT see. Can go to SDU.   Agree with plan for cath at some point but this is not the most pressing issue currently.   The patient is critically ill with multiple organ systems failure and requires high complexity decision making for assessment and support, frequent evaluation and titration of therapies, application of  advanced monitoring technologies and extensive interpretation of multiple databases.   Critical Care Time devoted to patient care services described in this note is 35 Minutes.  Ashwin Tibbs,MD 5:45 AM

## 2014-10-19 LAB — IMMUNOFIXATION, URINE

## 2014-10-19 LAB — CULTURE, BLOOD (ROUTINE X 2)
CULTURE: NO GROWTH
Culture: NO GROWTH

## 2014-10-19 LAB — CARBOXYHEMOGLOBIN
CARBOXYHEMOGLOBIN: 1.6 % — AB (ref 0.5–1.5)
METHEMOGLOBIN: 1.1 % (ref 0.0–1.5)
O2 Saturation: 59.3 %
Total hemoglobin: 10.9 g/dL — ABNORMAL LOW (ref 13.5–18.0)

## 2014-10-19 LAB — CBC
HCT: 34.4 % — ABNORMAL LOW (ref 39.0–52.0)
HEMOGLOBIN: 10.7 g/dL — AB (ref 13.0–17.0)
MCH: 24 pg — ABNORMAL LOW (ref 26.0–34.0)
MCHC: 31.1 g/dL (ref 30.0–36.0)
MCV: 77.1 fL — ABNORMAL LOW (ref 78.0–100.0)
Platelets: 276 10*3/uL (ref 150–400)
RBC: 4.46 MIL/uL (ref 4.22–5.81)
RDW: 21.9 % — ABNORMAL HIGH (ref 11.5–15.5)
WBC: 7.7 10*3/uL (ref 4.0–10.5)

## 2014-10-19 MED ORDER — SODIUM CHLORIDE 0.9 % IJ SOLN
10.0000 mL | Freq: Two times a day (BID) | INTRAMUSCULAR | Status: DC
  Administered 2014-10-19 – 2014-10-20 (×2): 30 mL
  Administered 2014-10-20 – 2014-10-27 (×12): 10 mL

## 2014-10-19 MED ORDER — SODIUM CHLORIDE 0.9 % IJ SOLN
10.0000 mL | INTRAMUSCULAR | Status: DC | PRN
  Administered 2014-10-19: 20 mL
  Administered 2014-10-19 – 2014-10-28 (×2): 10 mL

## 2014-10-19 MED ORDER — SODIUM CHLORIDE 0.9 % IV BOLUS (SEPSIS)
250.0000 mL | Freq: Once | INTRAVENOUS | Status: AC
  Administered 2014-10-19: 250 mL via INTRAVENOUS

## 2014-10-19 NOTE — Progress Notes (Signed)
[facility]  Patient Status: New pt for [facility] this admission  [facility] is providing the following services: [facility], and other disciplines if ordered at time of DC, [facility] for home Dobutamine.  [facility] infusion coordinator will support in hospital teaching for home Dobutamine therapy. Will need to work with support system, friend [name] [phone], for in hospital teaching as well.  Per Ms. Hundley she moved her to [place] with pt and has been with him for 3 years. She has agreed to learn specifics of CADD [employer] pump if pt does DC on Dobutamine as well as po med administration/compliance at home.  Will continue to follow and support DC when deemed appropriate by HF Team.     If patient discharges after hours, please call [phone].   Larry Sierras 10/19/2014, 12:58 PM

## 2014-10-19 NOTE — Progress Notes (Signed)
I have seen and examined this patient. I have discussed with Dr Nettey.  I agree with their findings and plans as documented in their progress note.    

## 2014-10-19 NOTE — Progress Notes (Signed)
S: Leroy Kennedy was seen today peripherally.  He is still in great spirits, however not much change in his condition.  Wheezing does seem to have improved and patient does state he is breathing somewhat easier compared to yesterday.  BP still low and fluctuating.  Patient is now tachy.    A: Leroy Kennedy is a 63 yo male with a cc of dyspnea, mildly productive cough, reproducible sternal CP, and vague RLQ abdominal pain.  PMH includes CHF, COPD, Depression, and back pain.  Symptoms most likely 2/2 CHF exacerbation and cardiogenic shock.  Will continue to follow peripherally.  Does seem to have a possible iron deficiency anemia, however this is low on the importance scale.  Still on Levophed and will defer to cardio on wean protocol.    Patient discussed with medical student and team. Family Medicine Teaching Service appreciates care provided by CCU/cardiology team and will resume care once patient is deemed medically stable by then.  Leroy Sinclair, MD PGY-3, [place]

## 2014-10-19 NOTE — Progress Notes (Signed)
Peripherally Inserted Central Catheter/Midline Placement  The IV Nurse has discussed with the patient and/or persons authorized to consent for the patient, the purpose of this procedure and the potential benefits and risks involved with this procedure.  The benefits include less needle sticks, lab draws from the catheter and patient may be discharged home with the catheter.  Risks include, but not limited to, infection, bleeding, blood clot (thrombus formation), and puncture of an artery; nerve damage and irregular heat beat.  Alternatives to this procedure were also discussed.  PICC/Midline Placement Documentation        Darlyn Read 10/19/2014, 11:52 AM

## 2014-10-19 NOTE — Progress Notes (Signed)
Patient ID: Leroy Kennedy, male   DOB: 09/04/1951, 63 y.o.   MRN: [id number] Advanced Heart Failure Rounding Note   Subjective:    Leroy Kennedy is a 63 yo male with a history of chronic systolic HF, COPD and tobacco abuse.   Underwent cardiac cath in 2013 in [place] after EF found to be 35%. Says cath showed normal arteries.   Moved from [place] to [place] about a week ago. Admitted with cardiogenic shock and 40 pound weight loss.   Echo with EF 20-25%, anteroseptal akinesis, moderate MR, RV dysfunction.  Co-ox 43% Started on milrinone and IV lasix.    Over the weekend he was switched off milrinone and started on dobutamine and norepinephrine.  Given NS due to low CVP.  He got more NS yesterday due to low CVP. Now off norepi. SBP 85-90. Dyspnea better this am. Co-ox 59%.CVP 2-3  CT Chest: negative for PE, 19 mm mass in LLL concerning for neoplasm Bilateral LE doppler: no DVT   Objective:   Weight Range:  Vital Signs:   Temp:  [97.3 F (36.3 C)-98.2 F (36.8 C)] 97.4 F (36.3 C) (10/21 0808) Pulse Rate:  [73-110] 85 (10/21 1000) Resp:  [12-24] 15 (10/21 1000) BP: (57-107)/(35-71) 64/48 mmHg (10/21 1000) SpO2:  [94 %-100 %] 99 % (10/21 1000) Weight:  [59.5 kg (131 lb 2.8 oz)] 59.5 kg (131 lb 2.8 oz) (10/21 0500) Last BM Date: 10/16/14  Weight change: Filed Weights   10/17/14 0530 10/18/14 0430 10/19/14 0500  Weight: 57.4 kg (126 lb 8.7 oz) 58.5 kg (128 lb 15.5 oz) 59.5 kg (131 lb 2.8 oz)    Intake/Output:   Intake/Output Summary (Last 24 hours) at 10/19/14 1034 Last data filed at 10/19/14 1000  Gross per 24 hour  Intake 1233.13 ml  Output   2700 ml  Net -1466.87 ml     Physical Exam: CVP 2-3 General: Chronically appearing.  NAD HEENT: normal except for poor dentition and temporal wasting  Neck: supple. JVP flat  Carotids 2+ bilat; no bruits. No lymphadenopathy or thryomegaly appreciated.  Cor: PMI laterally displaced. RRR. +s3. 2/6 MR  Lungs: clear, Abdomen: soft,  nontender, nondistended. No hepatosplenomegaly. No bruits or masses. Good bowel sounds.  Extremities: no cyanosis, clubbing, rash, no edema SCDs on  Neuro: alert & orientedx3, cranial nerves grossly intact. moves all 4 extremities w/o difficulty. Affect pleasant  Telemetry: SR 80-90s  Labs: Basic Metabolic Panel:  Recent Labs Lab 10/14/14 0500 10/15/14 0500 10/16/14 0409 10/17/14 0430 10/18/14 0438  NA 134* 130* 132* 136* 135*  K 4.0 4.5 4.0 4.1 4.4  CL 98 95* 94* 96 96  CO2 23 23 25 27 26   GLUCOSE 82 89 93 74 90  BUN 26* 18 18 20 23   CREATININE 0.98 0.82 0.83 0.84 0.91  CALCIUM 8.5 8.9 9.0 9.5 8.9  MG  --   --  1.9  --   --     Liver Function Tests:  Recent Labs Lab 10/12/14 1958  AST 19  ALT 19  ALKPHOS 113  BILITOT 0.4  PROT 7.4  ALBUMIN 3.1*    Recent Labs Lab 10/12/14 1958  LIPASE 15   No results found for this basename: AMMONIA,  in the last 168 hours  CBC:  Recent Labs Lab 10/16/14 0514 10/17/14 0430 10/17/14 1000 10/18/14 0438 10/19/14 0500  WBC 9.5 7.3 9.4 8.9 7.7  NEUTROABS  --   --  7.5  --   --   HGB 10.2* 11.1*  11.3* 11.1* 10.7*  HCT 31.7* 34.9* 34.9* 34.8* 34.4*  MCV 72.5* 73.3* 73.0* 73.7* 77.1*  PLT 249 276 302 303 276    Cardiac Enzymes:  Recent Labs Lab 10/12/14 1958 10/13/14 0049  TROPONINI <0.30 <0.30    BNP: BNP (last 3 results)  Recent Labs  10/12/14 1316  PROBNP 20243.0*     Other results:    Imaging: No results found.   Medications:     Scheduled Medications: . digoxin  0.125 mg Oral Daily  . enoxaparin (LOVENOX) injection  40 mg Subcutaneous Q24H  . feeding supplement (ENSURE COMPLETE)  237 mL Oral TID BM  . lidocaine  1 patch Transdermal Q24H  . pantoprazole  40 mg Oral Daily  . spironolactone  12.5 mg Oral Daily    Infusions: . sodium chloride 20 mL/hr at 10/19/14 0620  . DOBUTamine 5 mcg/kg/min (10/18/14 2000)  . norepinephrine (LEVOPHED) Adult infusion Stopped (10/18/14 1340)     PRN Medications: diphenhydrAMINE-zinc acetate, HYDROcodone-acetaminophen, morphine injection, ondansetron (ZOFRAN) IV   Assessment:   1) Cardiogenic shock 2) A/C systolic HF  - EF 40-10% with anteroseptal akinesis and RV dysfunction, moderate MR.   - Reportedly normal coronaries on cath 2013 in [place]  3) COPD  4) LLL mass, ?neoplasm 5) Unintentional weight loss  6) Acute respiratory failure  7) Cachexia  8) Iron deficient anemia 9) Hyponatremia   Plan/Discussion:    Cardiac output stable but BP still very soft. Off norepi for now. Remains on dobutamine. CVP is low. Will give another 250cc NS. Try to get CVP 6-8 range. Hold diuretics. Continue digoxin. Unable to tolerate ACE/b-blocker at this point.     I had a long talk with him regarding his situation. He is clearly inotrope dependent. He has just moved from [place] and has very limited family and social support. He is not candidate for mechanical support. Willing to consider home inotropes but I worry about his ability to handle. Can go to [facility] SDU. Place PICC.   Continue PT.   Agree with plan for cath at some point but this is not the most pressing issue currently.   Dorrene Bently,MD 10:34 AM

## 2014-10-19 NOTE — Progress Notes (Signed)
NUTRITION FOLLOW UP  DOCUMENTATION CODES  Per approved criteria   -Severe malnutrition in the context of chronic illness     Intervention:    Snacks TID between meals.  Will allow double protein and vegetable portions with meals.  D/C Ensure Complete supplement, patient dislikes.  Nutrition Dx:   Malnutrition related to inadequate oral intake as evidenced by severe depletion of muscle mass and 20% weight loss over the past 3 months. Ongoing.  Goal:   Intake to meet >90% of estimated nutrition needs. Progressing.  Monitor:   PO intake, labs, weight trend.  Assessment:   63 year old male presented to ED 10/14 c/o SOB, CP. He was admitted for presumed acute on chronic CHF and treated with diuresis. 10/15 early AM he became acutely more dyspneic and hypotensive with CP. He was transferred to ICU.  Patient reports that he is hungry and needs more food. He does not like the Ensure supplements, will d/c. Agreed to snacks TID between meals and double protein and vegetable portions with meals.  Height: Ht Readings from Last 1 Encounters:  10/12/14 5\' 11"  (1.803 m)    Weight Status:   Wt Readings from Last 1 Encounters:  10/19/14 131 lb 2.8 oz (59.5 kg)  10/13/14  132 lb 11.5 oz (60.2 kg)   Re-estimated needs:  Kcal: 2000-2200  Protein: 100-110 gm  Fluid: 2-2.2 L  Skin: intact  Diet Order: Sodium Restricted   Intake/Output Summary (Last 24 hours) at 10/19/14 1309 Last data filed at 10/19/14 1000  Gross per 24 hour  Intake 1122.03 ml  Output   2200 ml  Net -1077.97 ml    Last BM: 10/18   Labs:   Recent Labs Lab 10/15/14 0500 10/16/14 0409 10/17/14 0430 10/18/14 0438  NA 130* 132* 136* 135*  K 4.5 4.0 4.1 4.4  CL 95* 94* 96 96  CO2 23 25 27 26   BUN 18 18 20 23   CREATININE 0.82 0.83 0.84 0.91  CALCIUM 8.9 9.0 9.5 8.9  MG  --  1.9  --   --   GLUCOSE 89 93 74 90    CBG (last 3)  No results found for this basename: GLUCAP,  in the last 72  hours  Scheduled Meds: . digoxin  0.125 mg Oral Daily  . enoxaparin (LOVENOX) injection  40 mg Subcutaneous Q24H  . feeding supplement (ENSURE COMPLETE)  237 mL Oral TID BM  . lidocaine  1 patch Transdermal Q24H  . pantoprazole  40 mg Oral Daily  . sodium chloride  10-40 mL Intracatheter Q12H  . spironolactone  12.5 mg Oral Daily    Continuous Infusions: . sodium chloride 20 mL/hr at 10/19/14 0620  . DOBUTamine 5 mcg/kg/min (10/18/14 2000)  . norepinephrine (LEVOPHED) Adult infusion Stopped (10/18/14 1340)    Molli Barrows, RD, LDN, [place] Pager [phone] After Hours Pager [phone]

## 2014-10-20 ENCOUNTER — Encounter (HOSPITAL_COMMUNITY): Payer: Self-pay

## 2014-10-20 ENCOUNTER — Encounter (HOSPITAL_COMMUNITY)
Admission: EM | Disposition: A | Discharge status: Home Health | Payer: Self-pay | Source: Home / Self Care | Attending: Internal Medicine

## 2014-10-20 DIAGNOSIS — I2510 Atherosclerotic heart disease of native coronary artery without angina pectoris: Secondary | ICD-10-CM

## 2014-10-20 HISTORY — PX: LEFT AND RIGHT HEART CATHETERIZATION WITH CORONARY ANGIOGRAM: SHX5449

## 2014-10-20 LAB — POCT I-STAT 3, VENOUS BLOOD GAS (G3P V)
ACID-BASE DEFICIT: 1 mmol/L (ref 0.0–2.0)
Acid-Base Excess: 1 mmol/L (ref 0.0–2.0)
BICARBONATE: 25.6 meq/L — AB (ref 20.0–24.0)
Bicarbonate: 23.5 mEq/L (ref 20.0–24.0)
O2 SAT: 63 %
O2 Saturation: 67 %
PCO2 VEN: 39.4 mmHg — AB (ref 45.0–50.0)
TCO2: 25 mmol/L (ref 0–100)
TCO2: 27 mmol/L (ref 0–100)
pCO2, Ven: 38.4 mmHg — ABNORMAL LOW (ref 45.0–50.0)
pH, Ven: 7.385 — ABNORMAL HIGH (ref 7.250–7.300)
pH, Ven: 7.432 — ABNORMAL HIGH (ref 7.250–7.300)
pO2, Ven: 33 mmHg (ref 30.0–45.0)
pO2, Ven: 33 mmHg (ref 30.0–45.0)

## 2014-10-20 LAB — BASIC METABOLIC PANEL
Anion gap: 10 (ref 5–15)
BUN: 19 mg/dL (ref 6–23)
CO2: 25 mEq/L (ref 19–32)
Calcium: 9.3 mg/dL (ref 8.4–10.5)
Chloride: 103 mEq/L (ref 96–112)
Creatinine, Ser: 0.81 mg/dL (ref 0.50–1.35)
GFR calc Af Amer: >90 mL/min (ref >90)
GLUCOSE: 89 mg/dL (ref 70–99)
Potassium: 4.8 mEq/L (ref 3.7–5.3)
SODIUM: 138 meq/L (ref 137–147)

## 2014-10-20 LAB — CBC
HEMATOCRIT: 35.9 % — AB (ref 39.0–52.0)
HEMOGLOBIN: 11.5 g/dL — AB (ref 13.0–17.0)
MCH: 24.3 pg — AB (ref 26.0–34.0)
MCHC: 32 g/dL (ref 30.0–36.0)
MCV: 75.7 fL — AB (ref 78.0–100.0)
Platelets: 256 10*3/uL (ref 150–400)
RBC: 4.74 MIL/uL (ref 4.22–5.81)
RDW: 22.6 % — ABNORMAL HIGH (ref 11.5–15.5)
WBC: 8.1 10*3/uL (ref 4.0–10.5)

## 2014-10-20 LAB — CARBOXYHEMOGLOBIN
Carboxyhemoglobin: 1.6 % — ABNORMAL HIGH (ref 0.5–1.5)
Methemoglobin: 1 % (ref 0.0–1.5)
O2 SAT: 55.1 %
Total hemoglobin: 11.6 g/dL — ABNORMAL LOW (ref 13.5–18.0)

## 2014-10-20 LAB — POCT I-STAT 3, ART BLOOD GAS (G3+)
Acid-base deficit: 2 mmol/L (ref 0.0–2.0)
Bicarbonate: 21.3 mEq/L (ref 20.0–24.0)
O2 Saturation: 96 %
PCO2 ART: 31 mmHg — AB (ref 35.0–45.0)
TCO2: 22 mmol/L (ref 0–100)
pH, Arterial: 7.445 (ref 7.350–7.450)
pO2, Arterial: 80 mmHg (ref 80.0–100.0)

## 2014-10-20 SURGERY — LEFT AND RIGHT HEART CATHETERIZATION WITH CORONARY ANGIOGRAM
Anesthesia: LOCAL

## 2014-10-20 MED ORDER — FUROSEMIDE 10 MG/ML IJ SOLN
40.0000 mg | Freq: Two times a day (BID) | INTRAMUSCULAR | Status: DC
  Administered 2014-10-20 – 2014-10-21 (×2): 40 mg via INTRAVENOUS
  Filled 2014-10-20 (×4): qty 4

## 2014-10-20 MED ORDER — HEPARIN SODIUM (PORCINE) 1000 UNIT/ML IJ SOLN
INTRAMUSCULAR | Status: AC
  Filled 2014-10-20: qty 1

## 2014-10-20 MED ORDER — SODIUM CHLORIDE 0.9 % IJ SOLN: 3.0000 mL | INTRAMUSCULAR | Status: DC | PRN

## 2014-10-20 MED ORDER — SODIUM CHLORIDE 0.9 % IJ SOLN: 3.0000 mL | Freq: Two times a day (BID) | INTRAMUSCULAR | Status: DC

## 2014-10-20 MED ORDER — SODIUM CHLORIDE 0.9 % IV SOLN
250.0000 mL | INTRAVENOUS | Status: DC | PRN
Start: 2014-10-20 — End: 2014-10-20

## 2014-10-20 MED ORDER — ENOXAPARIN SODIUM 40 MG/0.4ML ~~LOC~~ SOLN
40.0000 mg | SUBCUTANEOUS | Status: AC
  Administered 2014-10-21: 40 mg via SUBCUTANEOUS
  Filled 2014-10-20: qty 0.4

## 2014-10-20 MED ORDER — HEPARIN (PORCINE) IN NACL 2-0.9 UNIT/ML-% IJ SOLN
INTRAMUSCULAR | Status: AC
  Filled 2014-10-20: qty 1000

## 2014-10-20 MED ORDER — ASPIRIN 81 MG PO CHEW
CHEWABLE_TABLET | ORAL | Status: AC
  Administered 2014-10-20: 13:00:00
  Filled 2014-10-20: qty 1

## 2014-10-20 MED ORDER — ASPIRIN 81 MG PO CHEW
81.0000 mg | CHEWABLE_TABLET | ORAL | Status: AC
  Administered 2014-10-20: 81 mg via ORAL

## 2014-10-20 MED ORDER — ONDANSETRON HCL 4 MG/2ML IJ SOLN
4.0000 mg | Freq: Four times a day (QID) | INTRAMUSCULAR | Status: DC | PRN
Start: 2014-10-20 — End: 2014-10-28

## 2014-10-20 MED ORDER — ENOXAPARIN SODIUM 40 MG/0.4ML ~~LOC~~ SOLN
40.0000 mg | SUBCUTANEOUS | Status: DC
  Filled 2014-10-20: qty 0.4

## 2014-10-20 MED ORDER — ACETAMINOPHEN 325 MG PO TABS: 650.0000 mg | ORAL_TABLET | ORAL | Status: DC | PRN

## 2014-10-20 MED ORDER — LIDOCAINE HCL (PF) 1 % IJ SOLN
INTRAMUSCULAR | Status: AC
  Filled 2014-10-20: qty 30

## 2014-10-20 MED ORDER — SODIUM CHLORIDE 0.9 % IV SOLN: INTRAVENOUS | Status: DC

## 2014-10-20 MED ORDER — VERAPAMIL HCL 2.5 MG/ML IV SOLN
INTRAVENOUS | Status: AC
  Filled 2014-10-20: qty 2

## 2014-10-20 MED ORDER — ENOXAPARIN SODIUM 40 MG/0.4ML ~~LOC~~ SOLN
40.0000 mg | SUBCUTANEOUS | Status: DC
  Administered 2014-10-21 – 2014-10-27 (×7): 40 mg via SUBCUTANEOUS
  Filled 2014-10-20 (×8): qty 0.4

## 2014-10-20 MED ORDER — SODIUM CHLORIDE 0.9 % IV SOLN: 250.0000 mL | INTRAVENOUS | Status: DC | PRN

## 2014-10-20 NOTE — Interval H&P Note (Signed)
History and Physical Interval Note:  10/20/2014 1:13 PM  Leroy Kennedy  has presented today for surgery, with the diagnosis of chf  The various methods of treatment have been discussed with the patient and family. After consideration of risks, benefits and other options for treatment, the patient has consented to  Procedure(s): LEFT AND RIGHT HEART CATHETERIZATION WITH CORONARY ANGIOGRAM (N/A) with Gordy Councilman catheter placement and possible coronary intervention as a surgical intervention .  The patient's history has been reviewed, patient examined, no change in status, stable for surgery.  I have reviewed the patient's chart and labs.  Questions were answered to the patient's satisfaction.    Cath Lab Visit (complete for each Cath Lab visit)  Clinical Evaluation Leading to the Procedure:   ACS: No.  Non-ACS:    Anginal Classification: CCS IV  Anti-ischemic medical therapy: No Therapy  Non-Invasive Test Results: No non-invasive testing performed  Prior CABG: No previous CABG       Glori Bickers

## 2014-10-20 NOTE — H&P (View-Only) (Signed)
Patient ID: Leroy Kennedy, male   DOB: 06/27/1951, 63 y.o.   MRN: [id number] Advanced Heart Failure Rounding Note   Subjective:    Leroy Kennedy is a 63 yo male with a history of chronic systolic HF, COPD and tobacco abuse.   Underwent cardiac cath in 2013 in [place] after EF found to be 35%. Says cath showed normal arteries.   Moved from [place] to [place] about a week ago. Admitted with cardiogenic shock and 40 pound weight loss.   Echo with EF 20-25%, anteroseptal akinesis, moderate MR, RV dysfunction.  Co-ox 43% Started on milrinone and IV lasix. Milrinone stopped due to ongoing hypotension.   Yesterday he continued on dobutamine and  norepi was restarted due to hypotension with SBP in 60s. Feels weak. Hungry. Mild CP.    CO-OX 55%  CVP 3   CT Chest: negative for PE, 19 mm mass in LLL concerning for neoplasm Bilateral LE doppler: no DVT   Objective:   Weight Range:  Vital Signs:   Temp:  [97 F (36.1 C)-97.7 F (36.5 C)] 97.5 F (36.4 C) (10/22 0819) Pulse Rate:  [70-110] 89 (10/22 1000) Resp:  [12-25] 22 (10/22 1000) BP: (58-107)/(43-89) 87/63 mmHg (10/22 1000) SpO2:  [93 %-100 %] 99 % (10/22 1000) Weight:  [131 lb 2.8 oz (59.5 kg)] 131 lb 2.8 oz (59.5 kg) (10/22 0430) Last BM Date: 10/16/14  Weight change: Filed Weights   10/18/14 0430 10/19/14 0500 10/20/14 0430  Weight: 128 lb 15.5 oz (58.5 kg) 131 lb 2.8 oz (59.5 kg) 131 lb 2.8 oz (59.5 kg)    Intake/Output:   Intake/Output Summary (Last 24 hours) at 10/20/14 1053 Last data filed at 10/20/14 1000  Gross per 24 hour  Intake   1894 ml  Output   1950 ml  Net    -56 ml     Physical Exam: CVP 2-3 General: Chronically appearing.  NAD HEENT: normal except for poor dentition and temporal wasting  Neck: supple. JVP flat  Carotids 2+ bilat; no bruits. No lymphadenopathy or thryomegaly appreciated.  Cor: PMI laterally displaced. RRR. +s3. 2/6 MR  Lungs: clear, Abdomen: soft, nontender, nondistended. No  hepatosplenomegaly. No bruits or masses. Good bowel sounds.  Extremities: no cyanosis, clubbing, rash, no edema SCDs on  Neuro: alert & orientedx3, cranial nerves grossly intact. moves all 4 extremities w/o difficulty. Affect pleasant  Telemetry: SR 80-90s  Labs: Basic Metabolic Panel:  Recent Labs Lab 10/15/14 0500 10/16/14 0409 10/17/14 0430 10/18/14 0438 10/20/14 0435  NA 130* 132* 136* 135* 138  K 4.5 4.0 4.1 4.4 4.8  CL 95* 94* 96 96 103  CO2 23 25 27 26 25   GLUCOSE 89 93 74 90 89  BUN 18 18 20 23 19   CREATININE 0.82 0.83 0.84 0.91 0.81  CALCIUM 8.9 9.0 9.5 8.9 9.3  MG  --  1.9  --   --   --     Liver Function Tests: No results found for this basename: AST, ALT, ALKPHOS, BILITOT, PROT, ALBUMIN,  in the last 168 hours No results found for this basename: LIPASE, AMYLASE,  in the last 168 hours No results found for this basename: AMMONIA,  in the last 168 hours  CBC:  Recent Labs Lab 10/17/14 0430 10/17/14 1000 10/18/14 0438 10/19/14 0500 10/20/14 0435  WBC 7.3 9.4 8.9 7.7 8.1  NEUTROABS  --  7.5  --   --   --   HGB 11.1* 11.3* 11.1* 10.7* 11.5*  HCT 34.9* 34.9*  34.8* 34.4* 35.9*  MCV 73.3* 73.0* 73.7* 77.1* 75.7*  PLT 276 302 303 276 256    Cardiac Enzymes: No results found for this basename: CKTOTAL, CKMB, CKMBINDEX, TROPONINI,  in the last 168 hours  BNP: BNP (last 3 results)  Recent Labs  10/12/14 1316  PROBNP 20243.0*     Other results:    Imaging: No results found.   Medications:     Scheduled Medications: . digoxin  0.125 mg Oral Daily  . enoxaparin (LOVENOX) injection  40 mg Subcutaneous Q24H  . lidocaine  1 patch Transdermal Q24H  . pantoprazole  40 mg Oral Daily  . sodium chloride  10-40 mL Intracatheter Q12H  . spironolactone  12.5 mg Oral Daily    Infusions: . sodium chloride 20 mL/hr at 10/20/14 1000  . DOBUTamine 5 mcg/kg/min (10/20/14 1000)  . norepinephrine (LEVOPHED) Adult infusion 8 mcg/min (10/20/14 1000)     PRN Medications: diphenhydrAMINE-zinc acetate, HYDROcodone-acetaminophen, morphine injection, ondansetron (ZOFRAN) IV, sodium chloride   Assessment:   1) Cardiogenic shock 2) A/C systolic HF  - EF 65-78% with anteroseptal akinesis and RV dysfunction, moderate MR.   - Reportedly normal coronaries on cath 2013 in [place]  3) COPD  4) LLL mass, ?neoplasm 5) Unintentional weight loss  6) Acute respiratory failure  7) Cachexia  8) Iron deficient anemia 9) Hyponatremia   Plan/Discussion:    BP remains soft. Continue norepi + dobutamine. CVP is low. Hold diuretics. Weight unchanged. Continue digoxin. Unable to tolerate ACE/b-blocker at this point.    Will need to move to [place] for swan.    May need cath at some point.    CLEGG,AMY NP-C  10:53 AM  Patient seen and examined with Darrick Grinder, NP. We discussed all aspects of the encounter. I agree with the assessment and plan as stated above.   He has persistent cardiogenic shock and hypotension despite dobutamine. Now back on norepi. Also with some CP. I think he needs to go to cath lab today for coronary angio and swan placement. We disked risks and indications of cath including stroke, vascular damage, MI, bleeding, renal failure, pneumothorax, infection and death. He is willing to proceed. Will transfer to [facility] ICU. Will stop spiro. Hydrate gently. May need midodrine to get him off levophed - we will see what SVR is.   The patient is critically ill with multiple organ systems failure and requires high complexity decision making for assessment and support, frequent evaluation and titration of therapies, application of advanced monitoring technologies and extensive interpretation of multiple databases.   Critical Care Time devoted to patient care services described in this note is 45 Minutes.  Helaina Stefano,MD 11:46 AM

## 2014-10-20 NOTE — CV Procedure (Addendum)
Cardiac Cath Procedure Note:  Indication: Heart failure  Procedures performed:  1) Selective coronary angiography 2) Left heart catheterization 3) Left ventriculogram 4) Right heart catheterization 5) Swan-Ganz catheter placement  Description of procedure:   The risks and indication of the procedure were explained. Consent was signed and placed on the chart. An appropriate timeout was taken prior to the procedure.   The right neck area was prepped and draped in routine sterile fashion and an 8FR venous sheath was placed in the RIJ under ultrasound guidance and a modified Seldinger technique. A standard Swan-Ganz catheter was used and a complete RHC was performed. The sheath was sutured in place and the Swan-Ganz catheter was left in for monitoring.   After a normal Allen's test was confirmed, the right wrist was prepped and draped in the routine sterile fashion and anesthetized with 1% local lidocaine. A 5 FR arterial sheath was then placed in the right radial artery using a modified Seldinger technique. Systemic heparin was administered. 3mg  IV verapamil was given through the sheath. Standard catheters including a JL 3.5, JR4 and straight pigtail were used. All catheter exchanges were made over a wire.  Complications:  None apparent  Findings:  Done on dobutamine 5 and levophed 8  RA: 2 RV: 63/2/6 PA: 69/30 (47) PCWP: 29 Fick CO/CI: 4.6/2.6 SVR: 1533 dynes PVR: 3.9 WU PA sats: 63%, 67 % Ao sat: 96% Ao Pressure: 119/70 (90) LV Pressure:  120/12/26 There was no signficant gradient across the aortic valve on pullback.  Left main: Distal 40% lesion extending into LAD and LCX  LAD: Ostial 40% otherwise normal  RAMUS: Large vessel 40% proximal otherwise normal.   LCX: Non-dominant. Made up mostly of OM-1. 50% ostial LCX. Otherwise normal  RCA: Large dominant vessel with large RV branch. Normal.   LV-gram done in the RAO projection: Ejection fraction = 20-25% Severe global  HK with akinesis of the basilar to mid inferior wall. 2+ MR  Assessment: 1. Moderate non-obstructive CAD 2. Severe NICM cardiomyopathy with EF 20-25%. 3. 2+ MR 4. Elevated filling pressures with moderate pulmonary HTN 5. Normal cardiac output and dual inotropes  Plan/Discussion:  Very difficult situation. His BP is inotrope dependent. Will move to CCU and wean inotropes as tolerated. Unfortunately not candidate for advanced therapies due to social situation. Diurese.   Daniel Bensimhon,MD 2:17 PM

## 2014-10-20 NOTE — Progress Notes (Signed)
0.9NS at 3cc/hreach  to side port, proximal, and proximal infusion ports of rt IJ SG

## 2014-10-20 NOTE — Progress Notes (Signed)
Branch Progress Note Patient Name: Leroy Kennedy DOB: [birth year] MRN: [id number]   Date of Service  10/20/2014  HPI/Events of Note  Back from cath, hungry  eICU Interventions  Start prior diet     Intervention Category Minor Interventions: Routine modifications to care plan (e.g. PRN medications for pain, fever)  Raylene Miyamoto. 10/20/2014, 7:03 PM

## 2014-10-20 NOTE — Progress Notes (Signed)
Report attempted to [facility]. Receiving RN unavailable. Spoke with Charge. They will call when ready.

## 2014-10-20 NOTE — Progress Notes (Signed)
Family Medicine Teaching Service appreciates care provided by CCU/cardiology team and will resume care once patient is deemed medically stable by them.    Hilton Sinclair, MD  PGY-3, [facility]  10/20/2014 3:35 PM

## 2014-10-20 NOTE — Progress Notes (Addendum)
Patient ID: Leroy Kennedy, male   DOB: 06-09-1951, 63 y.o.   MRN: [id number] Advanced Heart Failure Rounding Note   Subjective:    Leroy Kennedy is a 63 yo male with a history of chronic systolic HF, COPD and tobacco abuse.   Underwent cardiac cath in 2013 in [place] after EF found to be 35%. Says cath showed normal arteries.   Moved from [place] to [place] about a week ago. Admitted with cardiogenic shock and 40 pound weight loss.   Echo with EF 20-25%, anteroseptal akinesis, moderate MR, RV dysfunction.  Co-ox 43% Started on milrinone and IV lasix. Milrinone stopped due to ongoing hypotension.   Yesterday he continued on dobutamine and  norepi was restarted due to hypotension with SBP in 60s. Feels weak. Hungry. Mild CP.    CO-OX 55%  CVP 3   CT Chest: negative for PE, 19 mm mass in LLL concerning for neoplasm Bilateral LE doppler: no DVT   Objective:   Weight Range:  Vital Signs:   Temp:  [97 F (36.1 C)-97.7 F (36.5 C)] 97.5 F (36.4 C) (10/22 0819) Pulse Rate:  [70-110] 89 (10/22 1000) Resp:  [12-25] 22 (10/22 1000) BP: (58-107)/(43-89) 87/63 mmHg (10/22 1000) SpO2:  [93 %-100 %] 99 % (10/22 1000) Weight:  [131 lb 2.8 oz (59.5 kg)] 131 lb 2.8 oz (59.5 kg) (10/22 0430) Last BM Date: 10/16/14  Weight change: Filed Weights   10/18/14 0430 10/19/14 0500 10/20/14 0430  Weight: 128 lb 15.5 oz (58.5 kg) 131 lb 2.8 oz (59.5 kg) 131 lb 2.8 oz (59.5 kg)    Intake/Output:   Intake/Output Summary (Last 24 hours) at 10/20/14 1053 Last data filed at 10/20/14 1000  Gross per 24 hour  Intake   1894 ml  Output   1950 ml  Net    -56 ml     Physical Exam: CVP 2-3 General: Chronically appearing.  NAD HEENT: normal except for poor dentition and temporal wasting  Neck: supple. JVP flat  Carotids 2+ bilat; no bruits. No lymphadenopathy or thryomegaly appreciated.  Cor: PMI laterally displaced. RRR. +s3. 2/6 MR  Lungs: clear, Abdomen: soft, nontender, nondistended. No  hepatosplenomegaly. No bruits or masses. Good bowel sounds.  Extremities: no cyanosis, clubbing, rash, no edema SCDs on  Neuro: alert & orientedx3, cranial nerves grossly intact. moves all 4 extremities w/o difficulty. Affect pleasant  Telemetry: SR 80-90s  Labs: Basic Metabolic Panel:  Recent Labs Lab 10/15/14 0500 10/16/14 0409 10/17/14 0430 10/18/14 0438 10/20/14 0435  NA 130* 132* 136* 135* 138  K 4.5 4.0 4.1 4.4 4.8  CL 95* 94* 96 96 103  CO2 23 25 27 26 25   GLUCOSE 89 93 74 90 89  BUN 18 18 20 23 19   CREATININE 0.82 0.83 0.84 0.91 0.81  CALCIUM 8.9 9.0 9.5 8.9 9.3  MG  --  1.9  --   --   --     Liver Function Tests: No results found for this basename: AST, ALT, ALKPHOS, BILITOT, PROT, ALBUMIN,  in the last 168 hours No results found for this basename: LIPASE, AMYLASE,  in the last 168 hours No results found for this basename: AMMONIA,  in the last 168 hours  CBC:  Recent Labs Lab 10/17/14 0430 10/17/14 1000 10/18/14 0438 10/19/14 0500 10/20/14 0435  WBC 7.3 9.4 8.9 7.7 8.1  NEUTROABS  --  7.5  --   --   --   HGB 11.1* 11.3* 11.1* 10.7* 11.5*  HCT 34.9* 34.9*  34.8* 34.4* 35.9*  MCV 73.3* 73.0* 73.7* 77.1* 75.7*  PLT 276 302 303 276 256    Cardiac Enzymes: No results found for this basename: CKTOTAL, CKMB, CKMBINDEX, TROPONINI,  in the last 168 hours  BNP: BNP (last 3 results)  Recent Labs  10/12/14 1316  PROBNP 20243.0*     Other results:    Imaging: No results found.   Medications:     Scheduled Medications: . digoxin  0.125 mg Oral Daily  . enoxaparin (LOVENOX) injection  40 mg Subcutaneous Q24H  . lidocaine  1 patch Transdermal Q24H  . pantoprazole  40 mg Oral Daily  . sodium chloride  10-40 mL Intracatheter Q12H  . spironolactone  12.5 mg Oral Daily    Infusions: . sodium chloride 20 mL/hr at 10/20/14 1000  . DOBUTamine 5 mcg/kg/min (10/20/14 1000)  . norepinephrine (LEVOPHED) Adult infusion 8 mcg/min (10/20/14 1000)     PRN Medications: diphenhydrAMINE-zinc acetate, HYDROcodone-acetaminophen, morphine injection, ondansetron (ZOFRAN) IV, sodium chloride   Assessment:   1) Cardiogenic shock 2) A/C systolic HF  - EF 75-64% with anteroseptal akinesis and RV dysfunction, moderate MR.   - Reportedly normal coronaries on cath 2013 in [place]  3) COPD  4) LLL mass, ?neoplasm 5) Unintentional weight loss  6) Acute respiratory failure  7) Cachexia  8) Iron deficient anemia 9) Hyponatremia   Plan/Discussion:    BP remains soft. Continue norepi + dobutamine. CVP is low. Hold diuretics. Weight unchanged. Continue digoxin. Unable to tolerate ACE/b-blocker at this point.    Will need to move to [place] for swan.    May need cath at some point.    CLEGG,AMY NP-C  10:53 AM  Patient seen and examined with Darrick Grinder, NP. We discussed all aspects of the encounter. I agree with the assessment and plan as stated above.   He has persistent cardiogenic shock and hypotension despite dobutamine. Now back on norepi. Also with some CP. I think he needs to go to cath lab today for coronary angio and swan placement. We disked risks and indications of cath including stroke, vascular damage, MI, bleeding, renal failure, pneumothorax, infection and death. He is willing to proceed. Will transfer to [facility] ICU. Will stop spiro. Hydrate gently. May need midodrine to get him off levophed - we will see what SVR is.   The patient is critically ill with multiple organ systems failure and requires high complexity decision making for assessment and support, frequent evaluation and titration of therapies, application of advanced monitoring technologies and extensive interpretation of multiple databases.   Critical Care Time devoted to patient care services described in this note is 45 Minutes.  Geovanna Simko,MD 11:46 AM

## 2014-10-21 LAB — CBC
HEMATOCRIT: 37.9 % — AB (ref 39.0–52.0)
Hemoglobin: 11.9 g/dL — ABNORMAL LOW (ref 13.0–17.0)
MCH: 24.4 pg — AB (ref 26.0–34.0)
MCHC: 31.4 g/dL (ref 30.0–36.0)
MCV: 77.8 fL — AB (ref 78.0–100.0)
PLATELETS: 276 10*3/uL (ref 150–400)
RBC: 4.87 MIL/uL (ref 4.22–5.81)
RDW: 23.3 % — ABNORMAL HIGH (ref 11.5–15.5)
WBC: 10.7 10*3/uL — ABNORMAL HIGH (ref 4.0–10.5)

## 2014-10-21 LAB — CARBOXYHEMOGLOBIN
Carboxyhemoglobin: 1.8 % — ABNORMAL HIGH (ref 0.5–1.5)
METHEMOGLOBIN: 1.1 % (ref 0.0–1.5)
O2 Saturation: 60.2 %
Total hemoglobin: 11.9 g/dL — ABNORMAL LOW (ref 13.5–18.0)

## 2014-10-21 LAB — BASIC METABOLIC PANEL
ANION GAP: 14 (ref 5–15)
BUN: 20 mg/dL (ref 6–23)
CHLORIDE: 95 meq/L — AB (ref 96–112)
CO2: 24 meq/L (ref 19–32)
CREATININE: 0.74 mg/dL (ref 0.50–1.35)
Calcium: 9.5 mg/dL (ref 8.4–10.5)
GFR calc Af Amer: >90 mL/min (ref >90)
GFR calc non Af Amer: >90 mL/min (ref >90)
Glucose, Bld: 115 mg/dL — ABNORMAL HIGH (ref 70–99)
POTASSIUM: 4.1 meq/L (ref 3.7–5.3)
Sodium: 133 mEq/L — ABNORMAL LOW (ref 137–147)

## 2014-10-21 MED ORDER — SODIUM CHLORIDE 0.9 % IV BOLUS (SEPSIS)
500.0000 mL | Freq: Once | INTRAVENOUS | Status: AC
  Administered 2014-10-21: 500 mL via INTRAVENOUS

## 2014-10-21 MED ORDER — MIDODRINE HCL 2.5 MG PO TABS
2.5000 mg | ORAL_TABLET | Freq: Two times a day (BID) | ORAL | Status: DC
  Administered 2014-10-21: 2.5 mg via ORAL
  Filled 2014-10-21 (×4): qty 1

## 2014-10-21 MED ORDER — SODIUM CHLORIDE 0.9 % IV SOLN
Freq: Once | INTRAVENOUS | Status: AC
  Administered 2014-10-21: 15:00:00 via INTRAVENOUS

## 2014-10-21 MED ORDER — ATORVASTATIN CALCIUM 80 MG PO TABS
80.0000 mg | ORAL_TABLET | Freq: Every day | ORAL | Status: DC
  Administered 2014-10-21 – 2014-10-27 (×7): 80 mg via ORAL
  Filled 2014-10-21 (×8): qty 1

## 2014-10-21 MED ORDER — ASPIRIN EC 81 MG PO TBEC
81.0000 mg | DELAYED_RELEASE_TABLET | Freq: Every day | ORAL | Status: DC
  Administered 2014-10-21 – 2014-10-28 (×8): 81 mg via ORAL
  Filled 2014-10-21 (×8): qty 1

## 2014-10-21 MED ORDER — NOREPINEPHRINE BITARTRATE 1 MG/ML IV SOLN
2.0000 ug/min | INTRAVENOUS | Status: DC
  Administered 2014-10-21: 7 ug/min via INTRAVENOUS
  Administered 2014-10-21: 10 ug/min via INTRAVENOUS
  Administered 2014-10-22: 7 ug/min via INTRAVENOUS
  Administered 2014-10-23: 3 ug/min via INTRAVENOUS
  Administered 2014-10-23: 4 ug/min via INTRAVENOUS
  Filled 2014-10-21 (×2): qty 16

## 2014-10-21 NOTE — Progress Notes (Signed)
Patient ID: Leroy Kennedy, male   DOB: Feb 01, 1951, 63 y.o.   MRN: [id number] Advanced Heart Failure Rounding Note   Subjective:    Leroy Kennedy is a 63 yo male with a history of chronic systolic HF, COPD and tobacco abuse.   Underwent cardiac cath in 2013 in [place] after EF found to be 35%. Says cath showed normal arteries.   Moved from [place] to [place] this month. Admitted with cardiogenic shock and 40 pound weight loss.   Echo with EF 20-25%, anteroseptal akinesis, moderate MR, RV dysfunction.  Co-ox 43% Started on milrinone and IV lasix. Milrinone stopped due to ongoing hypotension. Has been dependent on dobutamine and levophed to maintain BP.  10/22 moved to [facility] ICU and had RHC/LHC with swan left in place. Remains on dobutamine 5 and levophed.   RHC/LHC 10/20/14  RA: 2  RV: 63/2/6  PA: 69/30 (47)  PCWP: 29  Fick CO/CI: 4.6/2.6  SVR: 1533 dynes  PVR: 3.9 WU  PA sats: 63%, 67 %  Ao sat: 96%  Ao Pressure: 119/70 (90)  LV Pressure: 120/12/26 Mod non obstructive CAD  Interval history: Feels ok. Hungry. Denies dyspnea. Diureses over [employer] #s  Today (done personally with nurse at bedside) - on levo 7 dobutamine 5 CVP 1 PA 29/11 (16) PCWP 3 Thermo CO/CI 4.6/3.6 SVR 1277  CO-OX 60%    CT Chest: negative for PE, 19 mm mass in LLL concerning for neoplasm Bilateral LE doppler: no DVT   Objective:   Weight Range:  Vital Signs:   Temp:  [97 F (36.1 C)-99 F (37.2 C)] 98.2 F (36.8 C) (10/23 0700) Pulse Rate:  [77-106] 78 (10/23 0700) Resp:  [11-29] 11 (10/23 0700) BP: (79-107)/(50-89) 92/71 mmHg (10/23 0700) SpO2:  [96 %-100 %] 98 % (10/23 0700) Weight:  [131 lb 2.8 oz (59.5 kg)-132 lb 11.5 oz (60.2 kg)] 132 lb 11.5 oz (60.2 kg) (10/23 0544) Last BM Date: 10/20/14  Weight change: Filed Weights   10/20/14 0430 10/20/14 1223 10/21/14 0544  Weight: 131 lb 2.8 oz (59.5 kg) 131 lb 2.8 oz (59.5 kg) 132 lb 11.5 oz (60.2 kg)    Intake/Output:   Intake/Output Summary  (Last 24 hours) at 10/21/14 0755 Last data filed at 10/21/14 0650  Gross per 24 hour  Intake 1650.67 ml  Output   2425 ml  Net -774.33 ml     Physical Exam: CVP 1 General: Chronically appearing.  NAD HEENT: normal except for poor dentition and temporal wasting  Neck: supple. JVP flat  Carotids 2+ bilat; no bruits. No lymphadenopathy or thryomegaly appreciated. RIJ swan  Cor: PMI laterally displaced. RRR. +s3. 2/6 MR  Lungs: clear, Abdomen: soft, nontender, nondistended. No hepatosplenomegaly. No bruits or masses. Good bowel sounds.  Extremities: no cyanosis, clubbing, rash, no edema SCDs on  Neuro: alert & orientedx3, cranial nerves grossly intact. moves all 4 extremities w/o difficulty. Affect pleasant  Telemetry: SR 80-90s  Labs: Basic Metabolic Panel:  Recent Labs Lab 10/15/14 0500 10/16/14 0409 10/17/14 0430 10/18/14 0438 10/20/14 0435 10/21/14 0500  NA 130* 132* 136* 135* 138 133*  K 4.5 4.0 4.1 4.4 4.8 4.1  CL 95* 94* 96 96 103 95*  CO2 23 25 27 26 25 24   GLUCOSE 89 93 74 90 89 115*  BUN 18 18 20 23 19 20   CREATININE 0.82 0.83 0.84 0.91 0.81 0.74  CALCIUM 8.9 9.0 9.5 8.9 9.3 9.5  MG  --  1.9  --   --   --   --  Liver Function Tests: No results found for this basename: AST, ALT, ALKPHOS, BILITOT, PROT, ALBUMIN,  in the last 168 hours No results found for this basename: LIPASE, AMYLASE,  in the last 168 hours No results found for this basename: AMMONIA,  in the last 168 hours  CBC:  Recent Labs Lab 10/17/14 0430 10/17/14 1000 10/18/14 0438 10/19/14 0500 10/20/14 0435 10/21/14 0500  WBC 7.3 9.4 8.9 7.7 8.1 10.7*  NEUTROABS  --  7.5  --   --   --   --   HGB 11.1* 11.3* 11.1* 10.7* 11.5* 11.9*  HCT 34.9* 34.9* 34.8* 34.4* 35.9* 37.9*  MCV 73.3* 73.0* 73.7* 77.1* 75.7* 77.8*  PLT 276 302 303 276 256 276    Cardiac Enzymes: No results found for this basename: CKTOTAL, CKMB, CKMBINDEX, TROPONINI,  in the last 168 hours  BNP: BNP (last 3  results)  Recent Labs  10/12/14 1316  PROBNP 20243.0*     Other results:    Imaging: No results found.   Medications:     Scheduled Medications: . digoxin  0.125 mg Oral Daily  . enoxaparin (LOVENOX) injection  40 mg Subcutaneous Q24H  . furosemide  40 mg Intravenous BID  . lidocaine  1 patch Transdermal Q24H  . pantoprazole  40 mg Oral Daily  . sodium chloride  10-40 mL Intracatheter Q12H    Infusions: . sodium chloride 20 mL/hr at 10/20/14 2315  . sodium chloride    . DOBUTamine 5 mcg/kg/min (10/20/14 2315)  . norepinephrine (LEVOPHED) Adult infusion 5 mcg/min (10/21/14 0500)    PRN Medications: acetaminophen, diphenhydrAMINE-zinc acetate, HYDROcodone-acetaminophen, morphine injection, ondansetron (ZOFRAN) IV, sodium chloride   Assessment:   1) Cardiogenic shock 2) A/C systolic HF  - EF 40-34% with anteroseptal akinesis and RV dysfunction, moderate MR.   - Reportedly normal coronaries on cath 2013 in [place]  3) COPD  4) LLL mass, ?neoplasm 5) Unintentional weight loss  6) Acute respiratory failure  7) Cachexia  8) Iron deficient anemia 9) Hyponatremia   Plan/Discussion:    BP remains soft. Continue norepi + dobutamine. CVP is low. Unable to add ace .    Very difficult situation. Will likely need goals of care conversation.    Add 81 mg aspirin and atorvastatin.   CLEGG,AMY NP-C  7:55 AM  Patient seen and examined with Darrick Grinder, NP. We discussed all aspects of the encounter. I agree with the assessment and plan as stated above.   Very difficult situation. Despite pressors we have been unable to stabilize him well. Volume status was up yesterday now way down. Intermittently hypotensive. Will continue inotropes. Stop diuretics. Give 500 NS. Leave swan in.  Due to social situation he is not candidate for advanced therapies. Thus, I think only option is to start midodrine to try and wean off levophed and use single agent dobutamine. It is  clearly not ideal but may be only option we have aside from Palliative Care. Keep in CCU over weekend with Swan in.   The patient is critically ill with multiple organ systems failure and requires high complexity decision making for assessment and support, frequent evaluation and titration of therapies, application of advanced monitoring technologies and extensive interpretation of multiple databases.   Critical Care Time devoted to patient care services described in this note is 35 Minutes.  Aurel Nguyen,MD 12:13 PM

## 2014-10-21 NOTE — Discharge Summary (Signed)
[facility] Discharge Summary  Patient name: Yorel Redder Medical record number: [id number] Date of birth: 11-02-1951 Age: 63 y.o. Gender: male Date of Admission: 10/12/2014  Date of Discharge: 10/28/14 Admitting Physician: Alveda Reasons, MD  Primary Care Provider: No PCP Per Patient Consultants: Cardiology (CCU), CCM, Palliative care  Indication for Hospitalization: Dyspnea and Hypotension concerning for CHF Exacerbation.  Discharge Diagnoses/Problem List:  Systolic CHF Exacerbation with Cardiogenic Shock Nonischemic cardiomyopathy COPD LLL mass, ?pulmonary neoplasm Unintentional weight loss Acute respiratory failure Cachexia Iron deficiency anemia Hyponatremia  Disposition: Home with [facility] and RN  Discharge Condition: Fair  Discharge Exam:  Filed Vitals:   10/27/14 2300 10/28/14 0300 10/28/14 0753 10/28/14 0904  BP: 97/72 76/57 81/50    Pulse: 95 84  91  Temp: 97.5 F (36.4 C) 97.8 F (36.6 C) 97.4 F (36.3 C)   TempSrc: Oral Oral Oral   Resp: 20 16    Height:      Weight:  130 lb 1.1 oz (59 kg)    SpO2: 97% 96% 100%    General: Chronically ill appearing. NAD, sitting in bed eating breakfast  HEENT: NCAT, poor dentition and temporal wasting  Cor: RRR. 2/6 MR  Lungs: CTAB anteriorly  Abdomen: soft, NTND. No hepatosplenomegaly. Good bowel sounds.  Extremities: no cyanosis, clubbing, rash, no edema, R UE 2L PICC  Neuro: alert & orientedx3, cranial nerves grossly intact. moves all 4 extremities w/o difficulty. Affect pleasant   Brief Hospital Course:  Mr. Robbins is a 63 yo male that came to the ER after recently moving here from [place].  CC of dyspnea, mildly productive cough, and vague abdominal pain.  Patient had a PMH significant for CHF with a EF of probably 25% per patient memory, COPD, Depression, and back pain.  Patients symptoms were concerning for CHF exacerbation.  As such, patient was started on Lasix for diuresis, troponins  were got for MI r/o, and echo was done to re-evaluate EF.  Despite treatment, patient continued to deteriorate with severe hypotension developing.  Eventually, patient required Milrinone to keep BP up and was placed in the ICU for close monitoring.  He was also started on Dobutamine and Digoxin for cardiac support.  Lasix was eventually d/c after a couple days of diuresis and once BP started to drop.  Patient was transitioned from Milrinone to Levophed.  Multiple attempts were made to wean the patient off pressors, however BP would continually fail to respond.  Cardiology was consulted throughout hospitalization to help with treatment plan and they took over primary care for most of the hospitalization.  Patient was eventually titrated up to high dose Midodrine (10mg  TID).  Palliative care was consulted after patient expressed wishes to just go home, as he knew that his heart was not going to improve.  Palliative care worked with CCU team to get it set up for patient to go home with home health and on dobutamine.  He was not eligible for hospice care while on dobutamine drip.  Back pain was controlled with Percocet 5-325 q8h scheduled and Oxy IR 5mg  q3h prn for breakthrough pain.  Anxiety and air hunger managed with Xanax 1mg  TID prn.  Of other significance, patient was incidentally discovered to have a 19 mm pulmonary nodule that has yet to be worked up on imaging.  Concern for pulmonary neoplasm.  Issues for Follow Up:  - BP, CHF management: Sent home on Midodrine 10 tid, Digoxin 0.125 daily,  Dobutamine 3 mcg/kg/min ,  Lasix 40 mg daily for weight of 133 or greater - Consider further evaluation of Pulmonary nodule (though first consider patient's prognosis and goals of care) - Back pain management - well controlled on discharge on {Percocet q8h and Oxy IR q3h prn for breakthrough pain - Patient would likely benefit from outpatient palliative care follow-up  Significant Procedures: LHC/RHC 10/22 -  showed EF 30-35% with mod non-obstructive CAD - Swan left in place (removed 10/28)  Significant Labs and Imaging:   Recent Labs Lab 10/23/14 0340  WBC 7.2  HGB 10.7*  HCT 34.4*  PLT 203    Recent Labs Lab 10/24/14 0530 10/25/14 0515 10/26/14 0430 10/27/14 0450 10/28/14 0427  NA 139 140 140 140 142  K 4.4 4.2 4.7 4.4 4.0  CL 103 103 104 104 106  CO2 26 24 25 24 25   GLUCOSE 84 138* 100* 92 103*  BUN 17 21 24* 21 20  CREATININE 0.62 0.68 0.78 0.68 0.65  CALCIUM 9.2 8.9 9.5 9.3 9.4    - Chest X-Ray: Probable small right pleural effusion with blunting of the right  costophrenic angle. No segmental infiltrate or pulmonary edema. Left  IJ central line in place.  - CT Abdomen/Pelvis: 1. CHF.  2. Negative for pulmonary embolism.  3. 19 mm nodule in the left lower lobe could reflect a neoplasm. PET  CT may be useful in determining need for biopsy.  4. Mild thoracic lymphadenopathy which can be re-evaluated at  follow-up.  5. No acute intra-abdominal findings.  6. Chronic appearing findings are discussed above.  - CT Angio: 1. CHF.  2. Negative for pulmonary embolism.  3. 19 mm nodule in the left lower lobe could reflect a neoplasm. PET  CT may be useful in determining need for biopsy.  4. Mild thoracic lymphadenopathy which can be re-evaluated at  follow-up.  5. No acute intra-abdominal findings.  6. Chronic appearing findings are discussed above.    Results/Tests Pending at Time of Discharge: none  Discharge Medications:    Medication List    STOP taking these medications       carvedilol 3.125 MG tablet  Commonly known as:  COREG     HYDROcodone-acetaminophen 10-325 MG per tablet  Commonly known as:  NORCO     ibuprofen 400 MG tablet  Commonly known as:  ADVIL,MOTRIN     lisinopril 2.5 MG tablet  Commonly known as:  PRINIVIL,ZESTRIL     potassium chloride SA 20 MEQ tablet  Commonly known as:  K-DUR,KLOR-CON     simvastatin 40 MG tablet  Commonly  known as:  ZOCOR      TAKE these medications       ALPRAZolam 1 MG tablet  Commonly known as:  XANAX  Take 1 tablet (1 mg total) by mouth 3 (three) times daily as needed for anxiety (AIR HUNGER).     aspirin 81 MG EC tablet  Take 1 tablet (81 mg total) by mouth daily.     atorvastatin 80 MG tablet  Commonly known as:  LIPITOR  Take 1 tablet (80 mg total) by mouth daily at 6 PM.     digoxin 0.125 MG tablet  Commonly known as:  LANOXIN  Take 1 tablet (0.125 mg total) by mouth daily.     DOBUTamine 4-5 MG/ML-% infusion  Commonly known as:  DOBUTREX  Inject 178.2 mcg/min into the vein continuous.     furosemide 40 MG tablet  Commonly known as:  LASIX  Take 1 tablet (40  mg total) by mouth daily as needed (if weight 133lbs or greater).     midodrine 10 MG tablet  Commonly known as:  PROAMATINE  Take 1 tablet (10 mg total) by mouth 3 (three) times daily with meals.     omeprazole 20 MG capsule  Commonly known as:  PRILOSEC  Take 20 mg by mouth daily.     oxyCODONE 5 MG immediate release tablet  Commonly known as:  Oxy IR/ROXICODONE  Take 1 tablet (5 mg total) by mouth every 3 (three) hours as needed for moderate pain, severe pain or breakthrough pain.     oxyCODONE-acetaminophen 5-325 MG per tablet  Commonly known as:  PERCOCET/ROXICET  Take 1 tablet by mouth every 8 (eight) hours.     senna-docusate 8.6-50 MG per tablet  Commonly known as:  Senokot-S  Take 1 tablet by mouth at bedtime.        Discharge Instructions: Please refer to Patient Instructions section of EMR for full details.  Patient was counseled important signs and symptoms that should prompt return to medical care, changes in medications, dietary instructions, activity restrictions, and follow up appointments.   Follow-Up Appointments: Follow-up Information   Follow up with [place] On 11/04/2014. (@ 8:15 am; Please bring all your medications to your visit.  Clinic is located in [facility] in the hospital. [facility] Code 5000)    Specialty:  Cardiology   Contact information:   [address] [id number] Danice Goltz [place] [address] [phone]      Follow up with [facility] RN)    Contact information:   [address] [place] [address] [phone]       Follow up with [place]    . (please call to arrange appointment for follow up with Primary Care Physician)    Contact information:   [place] [address] [phone]       Discharge summary started by :  Doroteo Bradford, Med Student  Discharge summary completed by: [name], MD, MPH PGY-1,  [facility] 10/28/2014, 10:36 AM

## 2014-10-21 NOTE — Progress Notes (Signed)
PT Cancellation Note  Patient Details Name: Kamonte Mcmichen MRN: [id number] DOB: [birth year]   Cancelled Treatment:    Reason Eval/Treat Not Completed: Medical issues which prohibited therapy (Pt now with Luiz Blare in place.)   Adilenne Ashworth 10/21/2014, 10:11 AM

## 2014-10-22 LAB — BASIC METABOLIC PANEL
Anion gap: 8 (ref 5–15)
Anion gap: 9 (ref 5–15)
BUN: 17 mg/dL (ref 6–23)
BUN: 23 mg/dL (ref 6–23)
CALCIUM: 5.5 mg/dL — AB (ref 8.4–10.5)
CALCIUM: 8.7 mg/dL (ref 8.4–10.5)
CO2: 17 mEq/L — ABNORMAL LOW (ref 19–32)
CO2: 23 mEq/L (ref 19–32)
Chloride: 119 mEq/L — ABNORMAL HIGH (ref 96–112)
Chloride: 101 mEq/L (ref 96–112)
Creatinine, Ser: 0.44 mg/dL — ABNORMAL LOW (ref 0.50–1.35)
Creatinine, Ser: 0.61 mg/dL (ref 0.50–1.35)
GFR calc Af Amer: >90 mL/min (ref >90)
Glucose, Bld: 66 mg/dL — ABNORMAL LOW (ref 70–99)
Glucose, Bld: 95 mg/dL (ref 70–99)
POTASSIUM: 2.8 meq/L — AB (ref 3.7–5.3)
POTASSIUM: 4.5 meq/L (ref 3.7–5.3)
SODIUM: 144 meq/L (ref 137–147)
SODIUM: 133 meq/L — AB (ref 137–147)

## 2014-10-22 LAB — CARBOXYHEMOGLOBIN
CARBOXYHEMOGLOBIN: 2 % — AB (ref 0.5–1.5)
Methemoglobin: 0.9 % (ref 0.0–1.5)
O2 SAT: 75.2 %
Total hemoglobin: 11.1 g/dL — ABNORMAL LOW (ref 13.5–18.0)

## 2014-10-22 MED ORDER — MIDODRINE HCL 5 MG PO TABS
5.0000 mg | ORAL_TABLET | Freq: Three times a day (TID) | ORAL | Status: DC
Start: 2014-10-22 — End: 2014-10-24
  Administered 2014-10-22 – 2014-10-23 (×6): 5 mg via ORAL
  Filled 2014-10-22 (×10): qty 1

## 2014-10-22 MED ORDER — POTASSIUM CHLORIDE CRYS ER 20 MEQ PO TBCR
20.0000 meq | EXTENDED_RELEASE_TABLET | Freq: Once | ORAL | Status: AC
  Administered 2014-10-22: 20 meq via ORAL
  Filled 2014-10-22: qty 1

## 2014-10-22 MED ORDER — MIDODRINE HCL 5 MG PO TABS
5.0000 mg | ORAL_TABLET | Freq: Three times a day (TID) | ORAL | Status: DC
  Filled 2014-10-22 (×3): qty 1

## 2014-10-22 MED ORDER — FUROSEMIDE 40 MG PO TABS: 40.0000 mg | ORAL_TABLET | Freq: Every day | ORAL | Status: DC

## 2014-10-22 NOTE — Progress Notes (Signed)
Family Medicine Teaching Service appreciates care provided by CCU/cardiology team and will resume care once patient is deemed medically stable by them.   Rosemarie Ax, MD PGY-2, [facility] 10/22/2014, 10:55 AM

## 2014-10-22 NOTE — Progress Notes (Addendum)
Patient ID: Khiree Bukhari, male   DOB: 18-Feb-1951, 63 y.o.   MRN: [id number] Advanced Heart Failure Rounding Note   Subjective:    Mr. Henshaw is a 63 yo male with a history of chronic systolic HF, COPD and tobacco abuse.   Underwent cardiac cath in 2013 in [place] after EF found to be 35%. Says cath showed normal arteries.   Moved from [place] to [place] this month. Admitted with cardiogenic shock and 40 pound weight loss.   Echo with EF 20-25%, anteroseptal akinesis, moderate MR, RV dysfunction.  Co-ox 43% Started on milrinone and IV lasix. Milrinone stopped due to ongoing hypotension. Has been dependent on dobutamine and levophed to maintain BP.  10/22 moved to [facility] ICU and had RHC/LHC with swan left in place.   RHC/LHC 10/20/14  RA: 2  RV: 63/2/6  PA: 69/30 (47)  PCWP: 29  Fick CO/CI: 4.6/2.6  SVR: 1533 dynes  PVR: 3.9 WU  PA sats: 63%, 67 %  Ao sat: 96%  Ao Pressure: 119/70 (90)  LV Pressure: 120/12/26 Mod non obstructive CAD  Interval history: Remains on dobutamine 5 and levophed 10. Feels ok. Hungry. Denies dyspnea. Good UOP.  Swan #s today - on levo 10 dobutamine 5 CVP 4 PA 48/15 PCWP 21 Thermo CI 3.4 CO-OX 75%    CT Chest: negative for PE, 19 mm mass in LLL concerning for neoplasm Bilateral LE doppler: no DVT   Objective:   Weight Range:  Vital Signs:   Temp:  [97 F (36.1 C)-98.2 F (36.8 C)] 97 F (36.1 C) (10/24 0700) Pulse Rate:  [73-104] 104 (10/24 0700) Resp:  [14-33] 14 (10/24 0700) BP: (63-126)/(26-95) 86/59 mmHg (10/24 0700) SpO2:  [95 %-100 %] 100 % (10/24 0700) Weight:  [130 lb 1.1 oz (59 kg)] 130 lb 1.1 oz (59 kg) (10/24 0500) Last BM Date: 10/21/14  Weight change: Filed Weights   10/20/14 1223 10/21/14 0544 10/22/14 0500  Weight: 131 lb 2.8 oz (59.5 kg) 132 lb 11.5 oz (60.2 kg) 130 lb 1.1 oz (59 kg)    Intake/Output:   Intake/Output Summary (Last 24 hours) at 10/22/14 0801 Last data filed at 10/22/14 0719  Gross per 24 hour  Intake  2208.61 ml  Output   3450 ml  Net -1241.39 ml     Physical Exam: CVP 4 General: Chronically appearing.  NAD HEENT: normal except for poor dentition and temporal wasting  Neck: supple. JVP 7  Carotids 2+ bilat; no bruits. No lymphadenopathy or thryomegaly appreciated. RIJ swan  Cor: PMI laterally displaced. RRR. +s3. 2/6 MR  Lungs: clear, Abdomen: soft, nontender, nondistended. No hepatosplenomegaly. No bruits or masses. Good bowel sounds.  Extremities: no cyanosis, clubbing, rash, no edema SCDs on  Neuro: alert & orientedx3, cranial nerves grossly intact. moves all 4 extremities w/o difficulty. Affect pleasant  Telemetry: SR 80-90s  Labs: Basic Metabolic Panel:  Recent Labs Lab 10/16/14 0409  10/18/14 0438 10/20/14 0435 10/21/14 0500 10/22/14 0400 10/22/14 0633  NA 132*  < > 135* 138 133* 144 133*  K 4.0  < > 4.4 4.8 4.1 2.8* 4.5  CL 94*  < > 96 103 95* 119* 101  CO2 25  < > 26 25 24  17* 23  GLUCOSE 93  < > 90 89 115* 66* 95  BUN 18  < > 23 19 20 17 23   CREATININE 0.83  < > 0.91 0.81 0.74 0.44* 0.61  CALCIUM 9.0  < > 8.9 9.3 9.5 5.5* 8.7  MG  1.9  --   --   --   --   --   --   < > = values in this interval not displayed.  Liver Function Tests: No results found for this basename: AST, ALT, ALKPHOS, BILITOT, PROT, ALBUMIN,  in the last 168 hours No results found for this basename: LIPASE, AMYLASE,  in the last 168 hours No results found for this basename: AMMONIA,  in the last 168 hours  CBC:  Recent Labs Lab 10/17/14 0430 10/17/14 1000 10/18/14 0438 10/19/14 0500 10/20/14 0435 10/21/14 0500  WBC 7.3 9.4 8.9 7.7 8.1 10.7*  NEUTROABS  --  7.5  --   --   --   --   HGB 11.1* 11.3* 11.1* 10.7* 11.5* 11.9*  HCT 34.9* 34.9* 34.8* 34.4* 35.9* 37.9*  MCV 73.3* 73.0* 73.7* 77.1* 75.7* 77.8*  PLT 276 302 303 276 256 276    Cardiac Enzymes: No results found for this basename: CKTOTAL, CKMB, CKMBINDEX, TROPONINI,  in the last 168 hours  BNP: BNP (last 3  results)  Recent Labs  10/12/14 1316  PROBNP 20243.0*     Other results:    Imaging: No results found.   Medications:     Scheduled Medications: . aspirin EC  81 mg Oral Daily  . atorvastatin  80 mg Oral q1800  . digoxin  0.125 mg Oral Daily  . enoxaparin (LOVENOX) injection  40 mg Subcutaneous Q24H  . lidocaine  1 patch Transdermal Q24H  . midodrine  2.5 mg Oral BID WC  . pantoprazole  40 mg Oral Daily  . sodium chloride  10-40 mL Intracatheter Q12H    Infusions: . sodium chloride 20 mL/hr at 10/21/14 2000  . sodium chloride Stopped (10/21/14 2000)  . DOBUTamine 5 mcg/kg/min (10/21/14 2000)  . norepinephrine (LEVOPHED) Adult infusion 10 mcg/min (10/21/14 2000)    PRN Medications: acetaminophen, diphenhydrAMINE-zinc acetate, HYDROcodone-acetaminophen, morphine injection, ondansetron (ZOFRAN) IV, sodium chloride   Assessment:   1) Cardiogenic shock 2) A/C systolic HF: Nonischemic cardiomyopathy.  - EF 20-25% with anteroseptal akinesis and RV dysfunction, moderate MR.   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  3) COPD  4) LLL mass, ?neoplasm 5) Unintentional weight loss  6) Acute respiratory failure  7) Cachexia  8) Iron deficient anemia 9) Hyponatremia   Plan/Discussion:    Very difficult situation. Still not able to wean pressor, in fact on higher dose of norepinephrine this morning. Volume status ok with mildly elevated PCWP/normal CVP.  Would continue to hold Lasix, got some fluid yesterday.   - Wean norepinephrine for SBP > 80 - Increase midodrine to 5 mg tid.  - Continue dobutamine, good output.   Due to social situation he is not candidate for advanced therapies. Only good option has been to use midodrine to try and wean off levophed and use single agent dobutamine. It is clearly not ideal but may be only option we have aside from Palliative Care. Keep in CCU over weekend with Swan in.    The patient is critically ill with multiple organ systems failure and requires high complexity decision making for assessment and support, frequent evaluation and titration of therapies, application of advanced monitoring technologies and extensive interpretation of multiple databases.   Jorryn Hershberger,MD 10/22/2014 8:01 AM

## 2014-10-22 NOTE — Progress Notes (Addendum)
CRITICAL VALUE ALERT  Critical value received:  K- 2.8 Ca- 5.5  Date of notification:  10/22/2014  Time of notification:  0615  Critical value read back:Yes.    Nurse who received alert:  Rosebud Poles RN, BSN  MD notified (1st page):  Tamala Julian  Time of first page:  0618  MD notified (2nd page):  Time of second page:  Responding MD:  Tamala Julian   Time MD responded:  [phone]  *Labs redrawn

## 2014-10-23 LAB — BASIC METABOLIC PANEL
Anion gap: 11 (ref 5–15)
BUN: 22 mg/dL (ref 6–23)
CO2: 24 meq/L (ref 19–32)
Calcium: 8.7 mg/dL (ref 8.4–10.5)
Chloride: 102 mEq/L (ref 96–112)
Creatinine, Ser: 0.59 mg/dL (ref 0.50–1.35)
GFR calc Af Amer: >90 mL/min (ref >90)
Glucose, Bld: 114 mg/dL — ABNORMAL HIGH (ref 70–99)
Potassium: 4.2 mEq/L (ref 3.7–5.3)
Sodium: 137 mEq/L (ref 137–147)

## 2014-10-23 LAB — CARBOXYHEMOGLOBIN
Carboxyhemoglobin: 1.6 % — ABNORMAL HIGH (ref 0.5–1.5)
Methemoglobin: 1 % (ref 0.0–1.5)
O2 SAT: 60.5 %
TOTAL HEMOGLOBIN: 10.6 g/dL — AB (ref 13.5–18.0)

## 2014-10-23 LAB — CBC
HEMATOCRIT: 34.4 % — AB (ref 39.0–52.0)
Hemoglobin: 10.7 g/dL — ABNORMAL LOW (ref 13.0–17.0)
MCH: 24.3 pg — AB (ref 26.0–34.0)
MCHC: 31.1 g/dL (ref 30.0–36.0)
MCV: 78 fL (ref 78.0–100.0)
PLATELETS: 203 10*3/uL (ref 150–400)
RBC: 4.41 MIL/uL (ref 4.22–5.81)
RDW: 23.5 % — ABNORMAL HIGH (ref 11.5–15.5)
WBC: 7.2 10*3/uL (ref 4.0–10.5)

## 2014-10-23 MED ORDER — DOBUTAMINE IN D5W 4-5 MG/ML-% IV SOLN
3.0000 ug/kg/min | INTRAVENOUS | Status: DC
  Administered 2014-10-25: 3 ug/kg/min via INTRAVENOUS
  Filled 2014-10-23: qty 250

## 2014-10-23 NOTE — Progress Notes (Signed)
Patient ID: Phi Avans, male   DOB: Apr 23, 1951, 63 y.o.   MRN: [id number]  Advanced Heart Failure Rounding Note   Subjective:    Mr. Aughenbaugh is a 63 yo male with a history of chronic systolic HF, COPD and tobacco abuse.   Underwent cardiac cath in 2013 in [place] after EF found to be 35%. Says cath showed normal arteries.   Moved from [place] to [place] this month. Admitted with cardiogenic shock and 40 pound weight loss.   Echo with EF 20-25%, anteroseptal akinesis, moderate MR, RV dysfunction.  Co-ox 43% Started on milrinone and IV lasix. Milrinone stopped due to ongoing hypotension. Has been dependent on dobutamine and levophed to maintain BP.  10/22 moved to [facility] ICU and had RHC/LHC with swan left in place.   RHC/LHC 10/20/14  RA: 2  RV: 63/2/6  PA: 69/30 (47)  PCWP: 29  Fick CO/CI: 4.6/2.6  SVR: 1533 dynes  PVR: 3.9 WU  PA sats: 63%, 67 %  Ao sat: 96%  Ao Pressure: 119/70 (90)  LV Pressure: 120/12/26 Mod non obstructive CAD  Interval history: Remains on dobutamine 5. Levophed weaned to 2-3. Midodrine increased 5 tid yesterday. However SBPs in 60-70s overnight.  Denies CP/SOB  Swan #s today - on levo 3 dobutamine 5 CVP 3 PA 65/23 (35) PCWP 12 Thermo - unable to use blue port Fick CO/CI = 4.3/2.5 CO-OX 61%    CT Chest: negative for PE, 19 mm mass in LLL concerning for neoplasm Bilateral LE doppler: no DVT   Objective:   Weight Range:  Vital Signs:   Temp:  [96.8 F (36 C)-98.1 F (36.7 C)] 97.3 F (36.3 C) (10/25 1142) Pulse Rate:  [65-96] 89 (10/25 1142) Resp:  [13-30] 13 (10/25 1142) BP: (58-102)/(39-81) 81/62 mmHg (10/25 1142) SpO2:  [92 %-100 %] 100 % (10/25 1142) Weight:  [59.7 kg (131 lb 9.8 oz)] 59.7 kg (131 lb 9.8 oz) (10/25 0439) Last BM Date: 10/22/14  Weight change: Filed Weights   10/21/14 0544 10/22/14 0500 10/23/14 0439  Weight: 60.2 kg (132 lb 11.5 oz) 59 kg (130 lb 1.1 oz) 59.7 kg (131 lb 9.8 oz)    Intake/Output:   Intake/Output  Summary (Last 24 hours) at 10/23/14 1212 Last data filed at 10/23/14 1100  Gross per 24 hour  Intake 1788.2 ml  Output   2651 ml  Net -862.8 ml     Physical Exam: CVP 4 General: Chronically appearing.  NAD HEENT: normal except for poor dentition and temporal wasting  Neck: supple. Carotids 2+ bilat; no bruits. No lymphadenopathy or thryomegaly appreciated. RIJ swan  Cor: PMI laterally displaced. RRR. +s3. 2/6 MR  Lungs: clear, Abdomen: soft, nontender, nondistended. No hepatosplenomegaly. No bruits or masses. Good bowel sounds.  Extremities: no cyanosis, clubbing, rash, no edema SCDs on  Neuro: alert & orientedx3, cranial nerves grossly intact. moves all 4 extremities w/o difficulty. Affect pleasant  Telemetry: SR 80-90s  Labs: Basic Metabolic Panel:  Recent Labs Lab 10/20/14 0435 10/21/14 0500 10/22/14 0400 10/22/14 0633 10/23/14 0340  NA 138 133* 144 133* 137  K 4.8 4.1 2.8* 4.5 4.2  CL 103 95* 119* 101 102  CO2 25 24 17* 23 24  GLUCOSE 89 115* 66* 95 114*  BUN 19 20 17 23 22   CREATININE 0.81 0.74 0.44* 0.61 0.59  CALCIUM 9.3 9.5 5.5* 8.7 8.7    Liver Function Tests: No results found for this basename: AST, ALT, ALKPHOS, BILITOT, PROT, ALBUMIN,  in the last 168  hours No results found for this basename: LIPASE, AMYLASE,  in the last 168 hours No results found for this basename: AMMONIA,  in the last 168 hours  CBC:  Recent Labs Lab 10/17/14 0430 10/17/14 1000 10/18/14 0438 10/19/14 0500 10/20/14 0435 10/21/14 0500 10/23/14 0340  WBC 7.3 9.4 8.9 7.7 8.1 10.7* 7.2  NEUTROABS  --  7.5  --   --   --   --   --   HGB 11.1* 11.3* 11.1* 10.7* 11.5* 11.9* 10.7*  HCT 34.9* 34.9* 34.8* 34.4* 35.9* 37.9* 34.4*  MCV 73.3* 73.0* 73.7* 77.1* 75.7* 77.8* 78.0  PLT 276 302 303 276 256 276 203    Cardiac Enzymes: No results found for this basename: CKTOTAL, CKMB, CKMBINDEX, TROPONINI,  in the last 168 hours  BNP: BNP (last 3 results)  Recent Labs  10/12/14 1316   PROBNP 20243.0*     Other results:    Imaging: No results found.   Medications:     Scheduled Medications: . aspirin EC  81 mg Oral Daily  . atorvastatin  80 mg Oral q1800  . digoxin  0.125 mg Oral Daily  . enoxaparin (LOVENOX) injection  40 mg Subcutaneous Q24H  . lidocaine  1 patch Transdermal Q24H  . midodrine  5 mg Oral TID WC  . pantoprazole  40 mg Oral Daily  . sodium chloride  10-40 mL Intracatheter Q12H    Infusions: . sodium chloride 20 mL/hr at 10/22/14 0915  . sodium chloride Stopped (10/21/14 2000)  . DOBUTamine 5 mcg/kg/min (10/22/14 1820)  . norepinephrine (LEVOPHED) Adult infusion 3.5 mcg/min (10/23/14 1131)    PRN Medications: acetaminophen, diphenhydrAMINE-zinc acetate, HYDROcodone-acetaminophen, morphine injection, ondansetron (ZOFRAN) IV, sodium chloride   Assessment:   1) Cardiogenic shock 2) A/C systolic HF: Nonischemic cardiomyopathy.  - EF 20-25% with anteroseptal akinesis and RV dysfunction, moderate MR.   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  3) COPD  4) LLL mass, ?neoplasm 5) Unintentional weight loss  6) Acute respiratory failure  7) Cachexia  8) Iron deficient anemia 9) Hyponatremia   Plan/Discussion:    Very difficult situation. Cardiac output improved but still not able to wean pressors due to hypotension. Volume status ok. BP into 60-70s last night despite increase in midodrine.   Due to social situation he is not candidate for advanced therapies. Only good option has been to use midodrine to try and wean off levophed and use single agent dobutamine. Will try to wean dobutamine to 3 and see if we can reduce degree of vasodilation to help wean levophed. Continue to hold diuretics. Current approach clearly not ideal but may be only option we have aside from Palliative Care. Keep in CCU over weekend with Swan in.   The patient is critically ill with multiple organ  systems failure and requires high complexity decision making for assessment and support, frequent evaluation and titration of therapies, application of advanced monitoring technologies and extensive interpretation of multiple databases.   Critical Care Time devoted to patient care services described in this note is 45 Minutes.    Quillian Quince Kiyomi Pallo,MD 10/23/2014 12:12 PM

## 2014-10-23 NOTE — Progress Notes (Signed)
Family Medicine Teaching Service appreciates care provided by CCU/cardiology team and will resume care once patient is deemed medically stable by them.   [name], MD PGY-1, [facility] 10/23/2014, 9:21 AM

## 2014-10-24 LAB — BASIC METABOLIC PANEL WITH GFR
Anion gap: 10 (ref 5–15)
BUN: 17 mg/dL (ref 6–23)
CO2: 26 meq/L (ref 19–32)
Calcium: 9.2 mg/dL (ref 8.4–10.5)
Chloride: 103 meq/L (ref 96–112)
Creatinine, Ser: 0.62 mg/dL (ref 0.50–1.35)
GFR calc Af Amer: >90 mL/min (ref >90)
GFR calc non Af Amer: >90 mL/min (ref >90)
Glucose, Bld: 84 mg/dL (ref 70–99)
Potassium: 4.4 meq/L (ref 3.7–5.3)
Sodium: 139 meq/L (ref 137–147)

## 2014-10-24 LAB — CARBOXYHEMOGLOBIN
Carboxyhemoglobin: 1.6 % — ABNORMAL HIGH (ref 0.5–1.5)
Methemoglobin: 0.9 % (ref 0.0–1.5)
O2 Saturation: 62.1 %
Total hemoglobin: 11.3 g/dL — ABNORMAL LOW (ref 13.5–18.0)

## 2014-10-24 MED ORDER — SODIUM CHLORIDE 0.9 % IJ SOLN
10.0000 mL | Freq: Two times a day (BID) | INTRAMUSCULAR | Status: DC
  Administered 2014-10-24: 10 mL

## 2014-10-24 MED ORDER — SODIUM CHLORIDE 0.9 % IJ SOLN: 10.0000 mL | INTRAMUSCULAR | Status: DC | PRN

## 2014-10-24 MED ORDER — HYDROCODONE-ACETAMINOPHEN 5-325 MG PO TABS
2.0000 | ORAL_TABLET | ORAL | Status: DC | PRN
  Administered 2014-10-24 – 2014-10-25 (×2): 2 via ORAL
  Filled 2014-10-24 (×2): qty 2

## 2014-10-24 MED ORDER — MIDODRINE HCL 5 MG PO TABS
7.5000 mg | ORAL_TABLET | Freq: Three times a day (TID) | ORAL | Status: DC
  Administered 2014-10-24 – 2014-10-25 (×6): 7.5 mg via ORAL
  Filled 2014-10-24 (×10): qty 1

## 2014-10-24 MED ORDER — FENTANYL CITRATE 0.05 MG/ML IJ SOLN
25.0000 ug | INTRAMUSCULAR | Status: DC | PRN
  Administered 2014-10-24 – 2014-10-26 (×9): 25 ug via INTRAVENOUS
  Filled 2014-10-24 (×9): qty 2

## 2014-10-24 MED ORDER — FUROSEMIDE 20 MG PO TABS
20.0000 mg | ORAL_TABLET | Freq: Every day | ORAL | Status: DC
  Administered 2014-10-24: 20 mg via ORAL
  Filled 2014-10-24 (×2): qty 1

## 2014-10-24 NOTE — Progress Notes (Signed)
Palliative Medicine Team consult received and I have reviewed his chart in detail. I have scheduled Leroy Kennedy for 11:30AM goals of care discussion on 10/27. I look forward to participating in his care. Full consult note and recommendations to follow.  Lane Hacker, DO Palliative Medicine [phone]

## 2014-10-24 NOTE — Progress Notes (Signed)
Patient ID: Leroy Kennedy, male   DOB: 12-05-1951, 63 y.o.   MRN: [id number]  Advanced Heart Failure Rounding Note   Subjective:    Mr. Kohlbeck is a 63 yo male with a history of chronic systolic HF, COPD and tobacco abuse.   Underwent cardiac cath in 2013 in [place] after EF found to be 35%. Says cath showed normal arteries.   Moved from [place] to [place] this month. Admitted with cardiogenic shock and 40 pound weight loss.   Echo with EF 20-25%, anteroseptal akinesis, moderate MR, RV dysfunction.  Co-ox 43% Started on milrinone and IV lasix. Milrinone stopped due to ongoing hypotension. Has been dependent on dobutamine and levophed to maintain BP.  10/22 moved to [facility] ICU and had RHC/LHC with swan left in place.   RHC/LHC 10/20/14 EF 30-35% Mod non obstructive CAD  Interval history: Remains on dobutamine 3 mcg and Levophed 3 mcg. SBP 60-90s. Denies SOB or CP. Complains of back pain. On midodrine 5 mg TID.   Swan #s today - on levo 3 dobutamine 3 CVP 3 PA 69/32 (43) Thermo - unable to use blue port Fick CO/CI: 4.1/2.4 PCWP 20 CO-OX 62%    CT Chest: negative for PE, 19 mm mass in LLL concerning for neoplasm Bilateral LE doppler: no DVT   Objective:   Weight Range:  Vital Signs:   Temp:  [97 F (36.1 C)-98.2 F (36.8 C)] 97.5 F (36.4 C) (10/26 0700) Pulse Rate:  [69-97] 92 (10/26 0700) Resp:  [13-32] 18 (10/26 0700) BP: (67-98)/(45-74) 91/74 mmHg (10/26 0700) SpO2:  [96 %-100 %] 99 % (10/26 0700) Weight:  [132 lb 11.5 oz (60.2 kg)] 132 lb 11.5 oz (60.2 kg) (10/26 0500) Last BM Date: 10/23/14  Weight change: Filed Weights   10/22/14 0500 10/23/14 0439 10/24/14 0500  Weight: 130 lb 1.1 oz (59 kg) 131 lb 9.8 oz (59.7 kg) 132 lb 11.5 oz (60.2 kg)    Intake/Output:   Intake/Output Summary (Last 24 hours) at 10/24/14 0722 Last data filed at 10/24/14 0600  Gross per 24 hour  Intake 1777.3 ml  Output   3175 ml  Net -1397.7 ml     Physical Exam: CVP 3 General:  Chronically appearing.  NAD HEENT: normal except for poor dentition and temporal wasting  Neck: supple. Carotids 2+ bilat; no bruits. No lymphadenopathy or thryomegaly appreciated. RIJ swan  Cor: PMI laterally displaced. RRR.. 2/6 MR  Lungs: clear, Abdomen: soft, nontender, nondistended. No hepatosplenomegaly. No bruits or masses. Good bowel sounds.  Extremities: no cyanosis, clubbing, rash, no edema, R UE 2L PICC Neuro: alert & orientedx3, cranial nerves grossly intact. moves all 4 extremities w/o difficulty. Affect pleasant  Telemetry: SR 80-90s  Labs: Basic Metabolic Panel:  Recent Labs Lab 10/21/14 0500 10/22/14 0400 10/22/14 0633 10/23/14 0340 10/24/14 0530  NA 133* 144 133* 137 139  K 4.1 2.8* 4.5 4.2 4.4  CL 95* 119* 101 102 103  CO2 24 17* 23 24 26   GLUCOSE 115* 66* 95 114* 84  BUN 20 17 23 22 17   CREATININE 0.74 0.44* 0.61 0.59 0.62  CALCIUM 9.5 5.5* 8.7 8.7 9.2    Liver Function Tests: No results found for this basename: AST, ALT, ALKPHOS, BILITOT, PROT, ALBUMIN,  in the last 168 hours No results found for this basename: LIPASE, AMYLASE,  in the last 168 hours No results found for this basename: AMMONIA,  in the last 168 hours  CBC:  Recent Labs Lab 10/17/14 1000 10/18/14 0438 10/19/14  0500 10/20/14 0435 10/21/14 0500 10/23/14 0340  WBC 9.4 8.9 7.7 8.1 10.7* 7.2  NEUTROABS 7.5  --   --   --   --   --   HGB 11.3* 11.1* 10.7* 11.5* 11.9* 10.7*  HCT 34.9* 34.8* 34.4* 35.9* 37.9* 34.4*  MCV 73.0* 73.7* 77.1* 75.7* 77.8* 78.0  PLT 302 303 276 256 276 203    Cardiac Enzymes: No results found for this basename: CKTOTAL, CKMB, CKMBINDEX, TROPONINI,  in the last 168 hours  BNP: BNP (last 3 results)  Recent Labs  10/12/14 1316  PROBNP 20243.0*     Other results:    Imaging: No results found.   Medications:     Scheduled Medications: . aspirin EC  81 mg Oral Daily  . atorvastatin  80 mg Oral q1800  . digoxin  0.125 mg Oral Daily  .  enoxaparin (LOVENOX) injection  40 mg Subcutaneous Q24H  . lidocaine  1 patch Transdermal Q24H  . midodrine  5 mg Oral TID WC  . pantoprazole  40 mg Oral Daily  . sodium chloride  10-40 mL Intracatheter Q12H    Infusions: . sodium chloride 20 mL/hr at 10/22/14 0915  . sodium chloride Stopped (10/21/14 2000)  . DOBUTamine 3 mcg/kg/min (10/23/14 1313)  . norepinephrine (LEVOPHED) Adult infusion 3 mcg/min (10/23/14 1213)    PRN Medications: acetaminophen, diphenhydrAMINE-zinc acetate, HYDROcodone-acetaminophen, morphine injection, ondansetron (ZOFRAN) IV, sodium chloride   Assessment:   1) Cardiogenic shock 2) A/C systolic HF: Nonischemic cardiomyopathy.  - EF 20-25% with anteroseptal akinesis and RV dysfunction, moderate MR.   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  3) COPD  4) LLL mass, ?neoplasm 5) Unintentional weight loss  6) Acute respiratory failure  7) Cachexia  8) Iron deficient anemia 9) Hyponatremia   Plan/Discussion:    Very difficult situation. Cardiac output improved but still not still on dual pressors. Will increase midodrine to 7.5 mg TID and continue to try and wean levophed for SBP > 80. Still remains a little dry will continue to hold diuretics.    Due to social situation he is not candidate for advanced therapies. Only good option has been to use midodrine to try and wean off levophed and use single agent dobutamine Current approach clearly not ideal. Would like to get Palliative Consult concerned about long term outcome. He has some support here and currently does not have any insurance. Appears that [facility] will take home with dobutamine if able to get off levophed and stable. Also some concern for LLL possible neoplasm has not been fully worked up.   Note from Family Medicine on 10/18 says DNR per discussion with patient however order for FULL CODE. Need to clarify.   Junie Bame B NP-C 7:40  AM  Patient seen and examined with Junie Bame, NP. We discussed all aspects of the encounter. I agree with the assessment and plan as stated above.   Continues to struggle with hypotension despite pressor support. Swan number obtained personally at bedside. Co-ox ok. Will increase midodrine and try to wean off levophed. PCWP now 20 and getting to point where we likely have to restart low-dose diuretics.   Long talk today about possibility of palliative care and he is open to palliative care consult to discuss [place].   We will continue to work to try to liberate him from levophed and get him home on dobutamine.  The patient is critically ill with  multiple organ systems failure and requires high complexity decision making for assessment and support, frequent evaluation and titration of therapies, application of advanced monitoring technologies and extensive interpretation of multiple databases.   Critical Care Time personally devoted to patient care services described in this note is 35 Minutes.  Daniel Bensimhon,MD 10:11 AM

## 2014-10-24 NOTE — Progress Notes (Addendum)
Family Medicine Teaching Service appreciates care provided by CCU/cardiology team and will resume care once patient is deemed medically stable by them.   Addendum: Consider consulting palliative care today, as patient reports he wants to go home and die in peace.  [name], MD PGY-1, [facility] 10/24/2014, 7:21 AM

## 2014-10-25 LAB — BASIC METABOLIC PANEL
ANION GAP: 13 (ref 5–15)
BUN: 21 mg/dL (ref 6–23)
CALCIUM: 8.9 mg/dL (ref 8.4–10.5)
CHLORIDE: 103 meq/L (ref 96–112)
CO2: 24 mEq/L (ref 19–32)
CREATININE: 0.68 mg/dL (ref 0.50–1.35)
GFR calc non Af Amer: >90 mL/min (ref >90)
Glucose, Bld: 138 mg/dL — ABNORMAL HIGH (ref 70–99)
Potassium: 4.2 mEq/L (ref 3.7–5.3)
SODIUM: 140 meq/L (ref 137–147)

## 2014-10-25 LAB — CARBOXYHEMOGLOBIN
CARBOXYHEMOGLOBIN: 1.6 % — AB (ref 0.5–1.5)
Carboxyhemoglobin: 1.6 % — ABNORMAL HIGH (ref 0.5–1.5)
Methemoglobin: 0.9 % (ref 0.0–1.5)
Methemoglobin: 1.1 % (ref 0.0–1.5)
O2 Saturation: 66.6 %
O2 Saturation: 59.8 %
TOTAL HEMOGLOBIN: 11.3 g/dL — AB (ref 13.5–18.0)
Total hemoglobin: 10.6 g/dL — ABNORMAL LOW (ref 13.5–18.0)

## 2014-10-25 MED ORDER — FUROSEMIDE 20 MG PO TABS
20.0000 mg | ORAL_TABLET | ORAL | Status: DC
  Administered 2014-10-26: 20 mg via ORAL
  Filled 2014-10-25: qty 1

## 2014-10-25 MED ORDER — OXYCODONE-ACETAMINOPHEN 5-325 MG PO TABS
1.0000 | ORAL_TABLET | Freq: Three times a day (TID) | ORAL | Status: DC
  Administered 2014-10-25 – 2014-10-28 (×10): 1 via ORAL
  Filled 2014-10-25 (×10): qty 1

## 2014-10-25 MED ORDER — ALPRAZOLAM 0.5 MG PO TABS
0.5000 mg | ORAL_TABLET | Freq: Three times a day (TID) | ORAL | Status: DC | PRN
  Administered 2014-10-25 (×2): 0.5 mg via ORAL
  Filled 2014-10-25 (×2): qty 1

## 2014-10-25 MED ORDER — OXYCODONE-ACETAMINOPHEN 5-325 MG PO TABS: 1.0000 | ORAL_TABLET | Freq: Three times a day (TID) | ORAL | Status: DC

## 2014-10-25 MED ORDER — SODIUM CHLORIDE 0.9 % IV BOLUS (SEPSIS)
200.0000 mL | Freq: Once | INTRAVENOUS | Status: AC
  Administered 2014-10-25: 200 mL via INTRAVENOUS

## 2014-10-25 NOTE — Progress Notes (Signed)
Patient ID: Leroy Kennedy, male   DOB: December 06, 1951, 63 y.o.   MRN: [id number]  Advanced Heart Failure Rounding Note   Subjective:    [name] is a 63 yo male with a history of chronic systolic HF, COPD and tobacco abuse.   Underwent cardiac cath in 2013 in [place] after EF found to be 35%. Says cath showed normal arteries.   Moved from [place] to [place] this month. Admitted with cardiogenic shock and 40 pound weight loss.   Echo with EF 20-25%, anteroseptal akinesis, moderate MR, RV dysfunction.  Co-ox 43% Started on milrinone and IV lasix. Milrinone stopped due to ongoing hypotension. Has been dependent on dobutamine and levophed to maintain BP.  10/22 moved to [facility] ICU and had RHC/LHC with swan left in place.   RHC/LHC 10/20/14 EF 30-35% Mod non obstructive CAD  Interval history: SBP in 70s overnight but now improved. Norepi off. Now only on levophed 3 mcg/kg/min. Feels more SOB after norepi stopped. Continues with back pain. Meeting with Palliative Care today for [place] discussion   Swan #s today - dobutamine 3 CVP 2 PA 45/16 (26) Thermo - unable to use blue port PCWP 12 CO-OX 67%    CT Chest: negative for PE, 19 mm mass in LLL concerning for neoplasm Bilateral LE doppler: no DVT   Objective:   Weight Range:  Vital Signs:   Temp:  [97.2 F (36.2 C)-98.4 F (36.9 C)] 97.5 F (36.4 C) (10/27 0808) Pulse Rate:  [70-131] 79 (10/27 0808) Resp:  [13-22] 18 (10/27 0808) BP: (71-123)/(33-84) 99/73 mmHg (10/27 0800) SpO2:  [93 %-100 %] 98 % (10/27 0808) Weight:  [60.6 kg (133 lb 9.6 oz)] 60.6 kg (133 lb 9.6 oz) (10/27 0400) Last BM Date: 10/24/14  Weight change: Filed Weights   10/23/14 0439 10/24/14 0500 10/25/14 0400  Weight: 59.7 kg (131 lb 9.8 oz) 60.2 kg (132 lb 11.5 oz) 60.6 kg (133 lb 9.6 oz)    Intake/Output:   Intake/Output Summary (Last 24 hours) at 10/25/14 0939 Last data filed at 10/25/14 0800  Gross per 24 hour  Intake 1401.45 ml  Output   2850 ml  Net  -1448.55 ml     Physical Exam: CVP 2 General: Chronically appearing.  NAD HEENT: normal except for poor dentition and temporal wasting  Neck: supple. Carotids 2+ bilat; no bruits. No lymphadenopathy or thryomegaly appreciated. RIJ swan  Cor: PMI laterally displaced. RRR.. 2/6 MR  Lungs: clear, Abdomen: soft, nontender, nondistended. No hepatosplenomegaly. No bruits or masses. Good bowel sounds.  Extremities: no cyanosis, clubbing, rash, no edema, R UE 2L PICC Neuro: alert & orientedx3, cranial nerves grossly intact. moves all 4 extremities w/o difficulty. Affect pleasant  Telemetry: SR 80-90s  Labs: Basic Metabolic Panel:  Recent Labs Lab 10/22/14 0400 10/22/14 0633 10/23/14 0340 10/24/14 0530 10/25/14 0515  NA 144 133* 137 139 140  K 2.8* 4.5 4.2 4.4 4.2  CL 119* 101 102 103 103  CO2 17* 23 24 26 24   GLUCOSE 66* 95 114* 84 138*  BUN 17 23 22 17 21   CREATININE 0.44* 0.61 0.59 0.62 0.68  CALCIUM 5.5* 8.7 8.7 9.2 8.9    Liver Function Tests: No results found for this basename: AST, ALT, ALKPHOS, BILITOT, PROT, ALBUMIN,  in the last 168 hours No results found for this basename: LIPASE, AMYLASE,  in the last 168 hours No results found for this basename: AMMONIA,  in the last 168 hours  CBC:  Recent Labs Lab 10/19/14 0500  10/20/14 0435 10/21/14 0500 10/23/14 0340  WBC 7.7 8.1 10.7* 7.2  HGB 10.7* 11.5* 11.9* 10.7*  HCT 34.4* 35.9* 37.9* 34.4*  MCV 77.1* 75.7* 77.8* 78.0  PLT 276 256 276 203    Cardiac Enzymes: No results found for this basename: CKTOTAL, CKMB, CKMBINDEX, TROPONINI,  in the last 168 hours  BNP: BNP (last 3 results)  Recent Labs  10/12/14 1316  PROBNP 20243.0*     Other results:    Imaging: No results found.   Medications:     Scheduled Medications: . aspirin EC  81 mg Oral Daily  . atorvastatin  80 mg Oral q1800  . digoxin  0.125 mg Oral Daily  . enoxaparin (LOVENOX) injection  40 mg Subcutaneous Q24H  . furosemide  20 mg  Oral Daily  . lidocaine  1 patch Transdermal Q24H  . midodrine  7.5 mg Oral TID WC  . pantoprazole  40 mg Oral Daily  . sodium chloride  10-40 mL Intracatheter Q12H    Infusions: . sodium chloride 20 mL/hr at 10/24/14 1600  . sodium chloride Stopped (10/21/14 2000)  . DOBUTamine 3 mcg/kg/min (10/25/14 0800)  . norepinephrine (LEVOPHED) Adult infusion Stopped (10/25/14 0800)    PRN Medications: acetaminophen, diphenhydrAMINE-zinc acetate, fentaNYL, HYDROcodone-acetaminophen, morphine injection, ondansetron (ZOFRAN) IV, sodium chloride   Assessment:   1) Cardiogenic shock 2) A/C systolic HF: Nonischemic cardiomyopathy.  - EF 20-25% with anteroseptal akinesis and RV dysfunction, moderate MR.   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  3) COPD  4) LLL mass, ?neoplasm 5) Unintentional weight loss  6) Acute respiratory failure  7) Cachexia  8) Iron deficient anemia 9) Hyponatremia   Plan/Discussion:    BP still tenous but seems improved. Off norepi for now but feels worse. PCWP ok. Will recheck co-ox. Will change lasix to every other day with PCWP only 12. Can increase midodrine as needed. BP too soft for ACE or b-blocker.   Palliative care consult today to discuss [place]. My goal is to get him home on dobutamine, if possible.   The patient is critically ill with multiple organ systems failure and requires high complexity decision making for assessment and support, frequent evaluation and titration of therapies, application of advanced monitoring technologies and extensive interpretation of multiple databases.   Critical Care Time personally devoted to patient care services described in this note is 35 Minutes.  [name] 9:39 AM

## 2014-10-25 NOTE — Progress Notes (Signed)
PT Cancellation Note  Patient Details Name: Ethelbert Thain MRN: [id number] DOB: 11-29-1951   Cancelled Treatment:    Reason Eval/Treat Not Completed: Medical issues which prohibited therapy (low BP)   Torian Thoennes 10/25/2014, 3:27 PM

## 2014-10-25 NOTE — Progress Notes (Signed)
NUTRITION FOLLOW UP  DOCUMENTATION CODES  Per approved criteria   -Severe malnutrition in the context of chronic illness     Intervention:    Snacks TID between meals.  Will allow double protein and vegetable portions with meals.   Nutrition Dx:   Malnutrition related to inadequate oral intake as evidenced by severe depletion of muscle mass and 20% weight loss over the past 3 months. Ongoing.  Goal:   Intake to meet >90% of estimated nutrition needs. met.  Monitor:   PO intake, labs, weight trend.  Assessment:   63 year old male presented to ED 10/14 c/o SOB, CP. He was admitted for presumed acute on chronic CHF and treated with diuresis. 10/15 early AM he became acutely more dyspneic and hypotensive with CP. He was transferred to ICU.  Palliative care meeting today and now following for goals of care. Pt eating 100% of his meals and snacks TID. Pt dislikes supplements.   Height: Ht Readings from Last 1 Encounters:  10/12/14 5' 11"  (1.803 m)    Weight Status:   Wt Readings from Last 1 Encounters:  10/25/14 133 lb 9.6 oz (60.6 kg)  10/13/14  132 lb 11.5 oz (60.2 kg)   Re-estimated needs:  Kcal: 2000-2200  Protein: 100-110 gm  Fluid: 2-2.2 L  Skin: intact  Diet Order: Sodium Restricted Meal Completion: 100%  Intake/Output Summary (Last 24 hours) at 10/25/14 1402 Last data filed at 10/25/14 1300  Gross per 24 hour  Intake 1030.9 ml  Output   1975 ml  Net -944.1 ml    Last BM: 10/26   Labs:   Recent Labs Lab 10/23/14 0340 10/24/14 0530 10/25/14 0515  NA 137 139 140  K 4.2 4.4 4.2  CL 102 103 103  CO2 24 26 24   BUN 22 17 21   CREATININE 0.59 0.62 0.68  CALCIUM 8.7 9.2 8.9  GLUCOSE 114* 84 138*    CBG (last 3)  No results found for this basename: GLUCAP,  in the last 72 hours  Scheduled Meds: . aspirin EC  81 mg Oral Daily  . atorvastatin  80 mg Oral q1800  . digoxin  0.125 mg Oral Daily  . enoxaparin (LOVENOX) injection  40 mg Subcutaneous  Q24H  . [START ON 10/26/2014] furosemide  20 mg Oral Q48H  . lidocaine  1 patch Transdermal Q24H  . midodrine  7.5 mg Oral TID WC  . oxyCODONE-acetaminophen  1 tablet Oral Q8H  . pantoprazole  40 mg Oral Daily  . sodium chloride  10-40 mL Intracatheter Q12H    Continuous Infusions: . sodium chloride 20 mL/hr at 10/24/14 1600  . sodium chloride Stopped (10/21/14 2000)  . DOBUTamine 3 mcg/kg/min (10/25/14 0800)  . norepinephrine (LEVOPHED) Adult infusion Stopped (10/25/14 0800)    [place], [place], [place] Pager [phone] After Hours Pager

## 2014-10-25 NOTE — Progress Notes (Signed)
Family Medicine Teaching Service appreciates care provided by CCU/cardiology team and will resume care once patient is deemed medically stable by them.   [name], MD PGY-1, [facility] 10/25/2014, 8:30 AM

## 2014-10-25 NOTE — Progress Notes (Signed)
Dr Haroldine Laws notified of lower BP, orders recieved

## 2014-10-25 NOTE — Consult Note (Addendum)
Palliative Medicine Team at [facility]  Date: 10/25/2014   Patient Name: Leroy Kennedy  DOB: April 21, 1951  MRN: [id number]  Age / Sex: 63 y.o., male   PCP: No Pcp Per Patient Referring Physician: Jolaine Artist, MD  Active Problems: Active Problems:   CHF exacerbation   Dyspnea   Protein-calorie malnutrition, severe   Acute on chronic systolic CHF (congestive heart failure)   Cardiogenic shock   HPI/Reason for Consultation: Leroy Kennedy is a 63 yo man with chronic systolic CHF, COPD and ongoing long term heavy tobacco use who was admitted on 10/14 with cardiogenic shock. Leroy Kennedy has had a difficult life- he recently moved to [place] (a week ago) from [place], [place] where is was homeless and lived in a makeshift shelter [place] the [place] and rented a storage shed close by where he could store his belongings an use their running water and electricity. He lost his home when his mother died in 02/07/2012. 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 He finished high school and started taking college classes but never completed them. He has two brothers in [place], one is institutionalized for mental illness and the other brother has been abusive. He had another brother who died of a stroke and was also homeless. Leroy Kennedy recently met a friend Sharyn Lull in [place] and they shared a room. Leroy Kennedy's son Leroy Kennedy had not seen his mother in almost 66 years because of the distance and had not been able to find her- but when Leroy Kennedy located her he offered to bring both Sharyn Lull and Leroy Kennedy back to [place] with him. They made a cross country drive in [name]'s truck two weeks ago. Leroy Kennedy rented a mobile home for them and has supported them for the past week at which time [name].  Leroy Kennedy has really not been well since 02-07-12 when he was first diagnosed with heart failure. His medical care was not consistent and fragmented and he had difficulty getting medical care because he was  uninsured.He is currently in cardiogenic shock requiring levophed and dobutamine to sustain his blood pressure. His overall prognosis is very poor. He also has severe lung disease/COPD and a 8m Lung Mass on CT. KJavarionreports that he has chronic/severe back pain and for the past 6 months has been having severe anxiety (PND) and wakes up gasping for air at night.  PMT consulted to begin conversations about goals of care, assist with symptom management and to assist with hospice options/care coordination. Leroy Kennedy me he just read an article in the [facility] about Palliative care and was looking forward to meeting with me.  Participants in Discussion: HCPOA: no   Advance Directive: none   Code Status Orders        Start     Ordered   10/25/14 1306  Do not attempt resuscitation (DNR)   Continuous    Question Answer Comment  In the event of cardiac or respiratory ARREST Do not call a "code blue"   In the event of cardiac or respiratory ARREST Do not perform Intubation, CPR, defibrillation or ACLS   In the event of cardiac or respiratory ARREST Use medication by any route, position, wound care, and other measures to relive pain and suffering. May use oxygen, suction and manual treatment of airway obstruction as needed for comfort.      10/25/14 1305      Kennedy have reviewed the medical record, interviewed the  patient and family, and examined the patient. The following aspects are pertinent.  Past Medical History  Diagnosis Date  . COPD (chronic obstructive pulmonary disease)   . Hypertension   . High cholesterol   . Chronic bronchitis   . GERD (gastroesophageal reflux disease)   . History of blood transfusion 2010    "had a punctured lung that was full of bile"  . Stroke     "Kennedy've had a couple small strokes this year; left me a little weak in RLE"  . Arthritis     "all over"  . Bulging lumbar disc   . CHF (congestive heart failure) dx'd 03/2012   History   Social History  .  Marital Status: Single    Spouse Name: N/A    Number of Children: N/A  . Years of Education: N/A   Social History Main Topics  . Smoking status: Current Every Day Smoker -- 0.50 packs/day for 46 years    Types: Cigarettes  . Smokeless tobacco: Never Used  . Alcohol Use: 4.8 oz/week    8 Cans of beer per week  . Drug Use: No  . Sexual Activity: Not Currently   Other Topics Concern  . None   Social History Narrative  . None   History reviewed. No pertinent family history. Scheduled Meds: . aspirin EC  81 mg Oral Daily  . atorvastatin  80 mg Oral q1800  . digoxin  0.125 mg Oral Daily  . enoxaparin (LOVENOX) injection  40 mg Subcutaneous Q24H  . [START ON 10/26/2014] furosemide  20 mg Oral Q48H  . lidocaine  1 patch Transdermal Q24H  . midodrine  7.5 mg Oral TID WC  . oxyCODONE-acetaminophen  1 tablet Oral Q8H  . pantoprazole  40 mg Oral Daily  . sodium chloride  10-40 mL Intracatheter Q12H   Continuous Infusions: . sodium chloride 20 mL/hr at 10/24/14 1600  . sodium chloride Stopped (10/21/14 2000)  . DOBUTamine 3 mcg/kg/min (10/25/14 2148)  . norepinephrine (LEVOPHED) Adult infusion Stopped (10/25/14 0800)   PRN Meds:.acetaminophen, ALPRAZolam, diphenhydrAMINE-zinc acetate, fentaNYL, morphine injection, ondansetron (ZOFRAN) IV, sodium chloride No Known Allergies CBC:    Component Value Date/Time   WBC 7.2 10/23/2014 0340   HGB 10.7* 10/23/2014 0340   HCT 34.4* 10/23/2014 0340   PLT 203 10/23/2014 0340   MCV 78.0 10/23/2014 0340   NEUTROABS 7.5 10/17/2014 1000   LYMPHSABS 1.0 10/17/2014 1000   MONOABS 0.6 10/17/2014 1000   EOSABS 0.3 10/17/2014 1000   BASOSABS 0.0 10/17/2014 1000   Comprehensive Metabolic Panel:    Component Value Date/Time   NA 140 10/25/2014 0515   K 4.2 10/25/2014 0515   CL 103 10/25/2014 0515   CO2 24 10/25/2014 0515   BUN 21 10/25/2014 0515   CREATININE 0.68 10/25/2014 0515   GLUCOSE 138* 10/25/2014 0515   CALCIUM 8.9 10/25/2014 0515    AST 19 10/12/2014 1958   ALT 19 10/12/2014 1958   ALKPHOS 113 10/12/2014 1958   BILITOT 0.4 10/12/2014 1958   PROT 7.4 10/12/2014 1958   ALBUMIN 3.1* 10/12/2014 1958    Vital Signs: BP 88/65  Pulse 80  Temp(Src) 97.5 F (36.4 C) (Core (Comment))  Resp 17  Ht _0  (1.803 m)  Wt 60.6 kg (133 lb 9.6 oz)  BMI 18.64 kg/m2  SpO2 97% Filed Weights   10/23/14 0439 10/24/14 0500 10/25/14 0400  Weight: 59.7 kg (131 lb 9.8 oz) 60.2 kg (132 lb 11.5 oz) 60.6 kg (133 lb  9.6 oz)   10/26 0701 - 10/27 0700 In: 1429.8 [P.O.:840; Kennedy.V.:589.8] Out: 3025 [Urine:3025]  Physical Exam:  Alert, Oriented, NAD, Cooperative and very pleasant. Thin, underweight, slightly disheveled +rhonchi, regular No extremity edema Slightly dusky skin tone Affect appropriate given circumstances Multiple CLs-+swan sheath  Summary of Established Goals of Care and Medical Treatment Preferences  Primary Diagnoses  1. Cardiogenic Shock 2. Systolic CHF, pressor dependent 3. Limited Material Resources 4. Lung Mass, ongoing tobacco abuse  Active Symptoms: 1. Severe Dyspnea and PND 2. Anxiety 3. Chronic Back Pain 4. Diffuse OA Pain  Psycho-social/Spiritual: see HPI  Prognosis: Very Poor for long term survival.   Palliative Performance Scale: 30%  Recommendations:  1. Code Status: DNR 2. Scope of Treatment:    Full Scope  Desires all reasonable medical interventions to stabilize his illness  Treatment of all reversible conditions  He desires more aggressive control of his pain and anxiety  Discussed his current disease process and potential trajectories of illness  Discussed Hospice options- may be limited by Dobutamine but Kennedy asked him to consider what his wishes would be in terms being dependent on an IV infusion. (Home, hospice facility.Mar[facility] Kitchen)  His home situation is safe and stable- Leroy Kennedy (his friend) rents a clean 2 bedroom mobile home for [place] and his own mother ([name]'s mother). [name]'s mother  and Neithan were roomates in [place]- her health is very poor too.  3. Symptom Management:   He has been taking hydrocodone ("3 at a time" for months several times a day with minor relief of his back pain-he noticed that it helped his SOB as well). Recommended starting him on Oxycodone 58m TID with a q4 prn BTP dose.   Alprazolam 0.565mq4 prn for anxiety/air hunger/sleep  He has severe tobacco/nictotine addiction- he tells me that if he is going to die he would just like to go ahead and smoke. Kennedy am hopeful the anxiety medicine will help- he is asking me about an electronic cigarette but will defer to hospital rules on this- since he is probably approaching EOL soon Kennedy am willing to be more flexible but Kennedy advised against cigarette smoking.  4. Palliative Prophylaxis: senna-s qhs and prn 5. Disposition:  [place] considering his options- we have started the conversation and will allow his goals to evolve. Ke[place]ips scared and in a place of passive acceptance that his life will be short- he is also a "thinker" and very anxious. If he survives to go home he will need very close oversight due to low health literacy and would be well served by Hospice- he is uninsured so it will be very challenging to provide home care and even more difficult to find a facility that could manage his cardiac needs. [place] a [place] in detail- but teh dobutamine infusion may again be the limiting factor. In an ideal world he could go to a hospice facility WITH his infusion so that he can have some dignity, compassion and care as he faces the end of his life but unfortunately current regulations for hospice care may make that difficult.   Time In: 12 noon Time Out: 1:30 PM Time Total: 90 minutes Greater than 50%  of this time was spent counseling and coordinating care related to the above assessment and plan.  Signed by: GORoma SchanzDO  10/25/2014, 11:56 PM  Please  contact Palliative Medicine Team phone at [phone] questions and concerns.  Will follow.  Lane Hacker, DO Palliative Medicine

## 2014-10-26 DIAGNOSIS — G8929 Other chronic pain: Secondary | ICD-10-CM | POA: Diagnosis present

## 2014-10-26 DIAGNOSIS — Z598 Other problems related to housing and economic circumstances: Secondary | ICD-10-CM

## 2014-10-26 DIAGNOSIS — M549 Dorsalgia, unspecified: Secondary | ICD-10-CM

## 2014-10-26 DIAGNOSIS — Z515 Encounter for palliative care: Secondary | ICD-10-CM

## 2014-10-26 DIAGNOSIS — Z66 Do not resuscitate: Secondary | ICD-10-CM | POA: Diagnosis not present

## 2014-10-26 DIAGNOSIS — F411 Generalized anxiety disorder: Secondary | ICD-10-CM | POA: Diagnosis present

## 2014-10-26 DIAGNOSIS — Z5987 Material hardship due to limited financial resources, not elsewhere classified: Secondary | ICD-10-CM

## 2014-10-26 LAB — BASIC METABOLIC PANEL
Anion gap: 11 (ref 5–15)
BUN: 24 mg/dL — ABNORMAL HIGH (ref 6–23)
CALCIUM: 9.5 mg/dL (ref 8.4–10.5)
CO2: 25 meq/L (ref 19–32)
CREATININE: 0.78 mg/dL (ref 0.50–1.35)
Chloride: 104 mEq/L (ref 96–112)
GFR calc Af Amer: >90 mL/min (ref >90)
GFR calc non Af Amer: >90 mL/min (ref >90)
Glucose, Bld: 100 mg/dL — ABNORMAL HIGH (ref 70–99)
Potassium: 4.7 mEq/L (ref 3.7–5.3)
Sodium: 140 mEq/L (ref 137–147)

## 2014-10-26 LAB — GLUCOSE, CAPILLARY: Glucose-Capillary: 93 mg/dL (ref 70–99)

## 2014-10-26 LAB — CARBOXYHEMOGLOBIN
Carboxyhemoglobin: 1.4 % (ref 0.5–1.5)
Methemoglobin: 0.7 % (ref 0.0–1.5)
O2 SAT: 59.9 %
Total hemoglobin: 11 g/dL — ABNORMAL LOW (ref 13.5–18.0)

## 2014-10-26 MED ORDER — SENNOSIDES-DOCUSATE SODIUM 8.6-50 MG PO TABS
1.0000 | ORAL_TABLET | Freq: Every day | ORAL | Status: DC
  Administered 2014-10-27: 1 via ORAL
  Filled 2014-10-26: qty 1

## 2014-10-26 MED ORDER — MIDODRINE HCL 5 MG PO TABS
10.0000 mg | ORAL_TABLET | Freq: Three times a day (TID) | ORAL | Status: DC
  Administered 2014-10-26 – 2014-10-28 (×8): 10 mg via ORAL
  Filled 2014-10-26 (×10): qty 2

## 2014-10-26 MED ORDER — SENNOSIDES-DOCUSATE SODIUM 8.6-50 MG PO TABS: 1.0000 | ORAL_TABLET | Freq: Every day | ORAL | Status: DC

## 2014-10-26 MED ORDER — OXYCODONE HCL 5 MG PO TABS
5.0000 mg | ORAL_TABLET | ORAL | Status: DC | PRN
  Administered 2014-10-26 – 2014-10-28 (×8): 5 mg via ORAL
  Filled 2014-10-26 (×8): qty 1

## 2014-10-26 MED ORDER — ALPRAZOLAM 0.5 MG PO TABS
1.0000 mg | ORAL_TABLET | Freq: Three times a day (TID) | ORAL | Status: DC | PRN
  Administered 2014-10-26 – 2014-10-28 (×8): 1 mg via ORAL
  Filled 2014-10-26 (×8): qty 2

## 2014-10-26 NOTE — Progress Notes (Signed)
Patient Leroy Kennedy      DOB: [birth year]      OFH:[id number]   Palliative Medicine Team at [facility] Progress Note    Subjective: Pain a bit better today.  Reports that oxycodone works better than IV fentanyl.  Dyspnea seems improved but not moved around much.  No n/v, loss of appetite, constipation. Hopes to be able to go home with dobutamine if needed.     Filed Vitals:   10/26/14 1600  BP: 88/61  Pulse: 86  Temp: 97.2 F (36.2 C)  Resp: 20   Physical exam: GEN: alert, NAD HEENT: [place], sclera anicteric CV: mmm Lungs; CTAB ABD; soft, ND EXT: no c/c/e  CBC    Component Value Date/Time   WBC 7.2 10/23/2014 0340   RBC 4.41 10/23/2014 0340   HGB 10.7* 10/23/2014 0340   HCT 34.4* 10/23/2014 0340   PLT 203 10/23/2014 0340   MCV 78.0 10/23/2014 0340   MCH 24.3* 10/23/2014 0340   MCHC 31.1 10/23/2014 0340   RDW 23.5* 10/23/2014 0340   LYMPHSABS 1.0 10/17/2014 1000   MONOABS 0.6 10/17/2014 1000   EOSABS 0.3 10/17/2014 1000   BASOSABS 0.0 10/17/2014 1000    CMP     Component Value Date/Time   NA 140 10/26/2014 0430   K 4.7 10/26/2014 0430   CL 104 10/26/2014 0430   CO2 25 10/26/2014 0430   GLUCOSE 100* 10/26/2014 0430   BUN 24* 10/26/2014 0430   CREATININE 0.78 10/26/2014 0430   CALCIUM 9.5 10/26/2014 0430   PROT 7.4 10/12/2014 1958   ALBUMIN 3.1* 10/12/2014 1958   AST 19 10/12/2014 1958   ALT 19 10/12/2014 1958   ALKPHOS 113 10/12/2014 1958   BILITOT 0.4 10/12/2014 1958   GFRNONAA >90 10/26/2014 0430   GFRAA >90 10/26/2014 0430     Assessment and plan: 63 yo male with PMHx diastolic HF, COPD, tobacco abuse who presented with cardiogenic shock. Ongoing need for inotropic support. Palliative consulted goals of care.    1. Code Status- DNR  2. Goals of Care- see initial consultation by Dr Hilma Favors.  He is hopeful of being able to go home with dobutamine if needed which would preclude hospice care.  Still emotionally struggling with his unexpected  situation.  Will continue to follow along.    3.  Symptom Management  A.- Anxiety- xanax increased today. Hopefully continues to help  B. Pain- chronic back pain. Oxycodone working better. Can keep scheduled for now. Will d/c fentanyl and replace with PRN oxycodone dose as this works better.    Total Time: 30 minutes  >50% Of time spent in counseling and coordination of care regarding above assessment and plan  Doran Clay D.O. Palliative Medicine Team at [facility]  Pager: [phone] Team Phone: [phone]

## 2014-10-26 NOTE — Progress Notes (Signed)
Patient ID: Leroy Kennedy, male   DOB: 07/28/1951, 63 y.o.   MRN: [id number]  Advanced Heart Failure Rounding Note   Subjective:    Mr. Hinderer is a 63 yo male with a history of chronic systolic HF, COPD and tobacco abuse.   Underwent cardiac cath in 2013 in [place] after EF found to be 35%. Says cath showed normal arteries.   Moved from [place] to [place] this month after being homeless and having difficult life. Admitted with cardiogenic shock and 40 pound weight loss.   Echo with EF 20-25%, anteroseptal akinesis, moderate MR, RV dysfunction.  Co-ox 43% Started on milrinone and IV lasix. Milrinone stopped due to ongoing hypotension. Has been dependent on dobutamine and levophed to maintain BP.  10/22 moved to [facility] ICU and had RHC/LHC with swan left in place.   RHC/LHC 10/20/14 EF 30-35% Mod non obstructive CAD  Interval history: Met with palliative care yesterday and now DNR and deciding on palliative approach/hospice vs continued aggressive medical support. Lasix held yesterday. SBP remains low 50-90s on dobutamine 3 mcg and levo remains off. Continues with back pain.   Swan #s today - dobutamine 3 CVP 4 PA 47/27 (37) CO-OX 60%  CT Chest: negative for PE, 19 mm mass in LLL concerning for neoplasm Bilateral LE doppler: no DVT   Objective:   Weight Range:  Vital Signs:   Temp:  [97 F (36.1 C)-98.2 F (36.8 C)] 97.7 F (36.5 C) (10/28 0700) Pulse Rate:  [75-102] 80 (10/27 2100) Resp:  [12-24] 23 (10/28 0700) BP: (52-105)/(37-82) 89/63 mmHg (10/28 0700) SpO2:  [95 %-100 %] 97 % (10/27 2100) Weight:  [133 lb 9.6 oz (60.6 kg)] 133 lb 9.6 oz (60.6 kg) (10/28 0335) Last BM Date: 10/25/14  Weight change: Filed Weights   10/24/14 0500 10/25/14 0400 10/26/14 0335  Weight: 132 lb 11.5 oz (60.2 kg) 133 lb 9.6 oz (60.6 kg) 133 lb 9.6 oz (60.6 kg)    Intake/Output:   Intake/Output Summary (Last 24 hours) at 10/26/14 0728 Last data filed at 10/26/14 0700  Gross per 24 hour  Intake  1502.1 ml  Output   1800 ml  Net -297.9 ml     Physical Exam: CVP 4 General: Chronically appearing.  NAD, anxious HEENT: normal except for poor dentition and temporal wasting  Neck: supple. Carotids 2+ bilat; no bruits. No lymphadenopathy or thryomegaly appreciated. RIJ swan  Cor: PMI laterally displaced. RRR.. 2/6 MR  Lungs: clear, Abdomen: soft, nontender, nondistended. No hepatosplenomegaly. No bruits or masses. Good bowel sounds.  Extremities: no cyanosis, clubbing, rash, no edema, R UE 2L PICC Neuro: alert & orientedx3, cranial nerves grossly intact. moves all 4 extremities w/o difficulty. Affect pleasant  Telemetry: SR 80-90s  Labs: Basic Metabolic Panel:  Recent Labs Lab 10/22/14 0633 10/23/14 0340 10/24/14 0530 10/25/14 0515 10/26/14 0430  NA 133* 137 139 140 140  K 4.5 4.2 4.4 4.2 4.7  CL 101 102 103 103 104  CO2 23 24 26 24 25   GLUCOSE 95 114* 84 138* 100*  BUN 23 22 17 21  24*  CREATININE 0.61 0.59 0.62 0.68 0.78  CALCIUM 8.7 8.7 9.2 8.9 9.5    Liver Function Tests: No results found for this basename: AST, ALT, ALKPHOS, BILITOT, PROT, ALBUMIN,  in the last 168 hours No results found for this basename: LIPASE, AMYLASE,  in the last 168 hours No results found for this basename: AMMONIA,  in the last 168 hours  CBC:  Recent Labs Lab 10/20/14  0435 10/21/14 0500 10/23/14 0340  WBC 8.1 10.7* 7.2  HGB 11.5* 11.9* 10.7*  HCT 35.9* 37.9* 34.4*  MCV 75.7* 77.8* 78.0  PLT 256 276 203    Cardiac Enzymes: No results found for this basename: CKTOTAL, CKMB, CKMBINDEX, TROPONINI,  in the last 168 hours  BNP: BNP (last 3 results)  Recent Labs  10/12/14 1316  PROBNP 20243.0*     Other results:    Imaging: No results found.   Medications:     Scheduled Medications: . aspirin EC  81 mg Oral Daily  . atorvastatin  80 mg Oral q1800  . digoxin  0.125 mg Oral Daily  . enoxaparin (LOVENOX) injection  40 mg Subcutaneous Q24H  . furosemide  20 mg  Oral Q48H  . lidocaine  1 patch Transdermal Q24H  . midodrine  7.5 mg Oral TID WC  . oxyCODONE-acetaminophen  1 tablet Oral Q8H  . pantoprazole  40 mg Oral Daily  . [START ON 10/27/2014] senna-docusate  1 tablet Oral QHS  . sodium chloride  10-40 mL Intracatheter Q12H    Infusions: . sodium chloride 20 mL/hr at 10/26/14 0629  . sodium chloride Stopped (10/21/14 2000)  . DOBUTamine 3 mcg/kg/min (10/25/14 2148)  . norepinephrine (LEVOPHED) Adult infusion Stopped (10/25/14 0800)    PRN Medications: acetaminophen, ALPRAZolam, diphenhydrAMINE-zinc acetate, fentaNYL, ondansetron (ZOFRAN) IV, sodium chloride   Assessment:   1) Cardiogenic shock 2) A/C systolic HF: Nonischemic cardiomyopathy.  - EF 20-25% with anteroseptal akinesis and RV dysfunction, moderate MR.   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  3) COPD  4) LLL mass, ?neoplasm 5) Unintentional weight loss  6) Acute respiratory failure  7) Cachexia  8) Iron deficient anemia 9) Hyponatremia   Plan/Discussion:    Mr. Burright remains very tenuous. Yesterday met with Palliative Care and has decided to be a DNR and is still trying to make a decision about whether Hospice or continue aggressive medical treatment. Very unfortunate situation and has had a very difficult life. He remains very anxious and having back pain. Will increase Xanax to 1 mg TID PRN.   SBP remains low, will increase midodrine to 10 mg TID. Continue dobutamine 3 mcg through PICC. Will discontinue swan today. BP too soft for ACE or b-blocker.   Could transfer to stepdown today.   Rande Brunt, NP-C 7:28 AM  Patient seen and examined with Junie Bame, NP. We discussed all aspects of the encounter. I agree with the assessment and plan as stated above.   Unfortunately we are running out of options for him. BP remains low. Agree with increasing midodrine. Can d/c swan and move to SDU or tele.  Consider d/c dobutamine.   [name] 3:58 PM

## 2014-10-26 NOTE — Progress Notes (Signed)
Physical Therapy Discharge Patient Details Name: Leroy Kennedy MRN: [id number] DOB: March 02, 1951 Today's Date: 10/26/2014 Time:  -     Patient discharged from PT services secondary to medical instability.  Please re-order if appropriate.  GP     Faye Sanfilippo 10/26/2014, 11:41 AM Suanne Marker PT [phone]

## 2014-10-26 NOTE — Progress Notes (Signed)
FMTS ATTENDING NOTE Duquan Gillooly,MD I  have seen and examined this patient, reviewed his chart.He denies any concern, he is still been managed by CCU.Family Medicine Teaching Service appreciates care provided by CCU/cardiology team and will resume care once patient is deemed medically stable by them.

## 2014-10-27 LAB — BASIC METABOLIC PANEL
Anion gap: 12 (ref 5–15)
BUN: 21 mg/dL (ref 6–23)
CALCIUM: 9.3 mg/dL (ref 8.4–10.5)
CO2: 24 mEq/L (ref 19–32)
CREATININE: 0.68 mg/dL (ref 0.50–1.35)
Chloride: 104 mEq/L (ref 96–112)
Glucose, Bld: 92 mg/dL (ref 70–99)
Potassium: 4.4 mEq/L (ref 3.7–5.3)
Sodium: 140 mEq/L (ref 137–147)

## 2014-10-27 LAB — CARBOXYHEMOGLOBIN
CARBOXYHEMOGLOBIN: 1.8 % — AB (ref 0.5–1.5)
METHEMOGLOBIN: 0.7 % (ref 0.0–1.5)
O2 Saturation: 61.1 %
Total hemoglobin: 10.5 g/dL — ABNORMAL LOW (ref 13.5–18.0)

## 2014-10-27 NOTE — Progress Notes (Signed)
[place] lives in [place] with friends. I have spoken several times with Mickel Baas, who is a new friend just since moving to [place] in the last month. I have also been working by phone with is girlfriend, Edmonia Lynch, who will be his primary caregiver and support once discharged.  Edmonia Lynch states she will help pt at home; is willing to learn basic management of CADD Prizm pump and Milrinone infusion at home as well as help with meds, meals, transportation arrangements, etc.  [facility] has been working with [employer], mainly [place], regarding lack of payer for health care.  Caryl Pina has worked with pt on MCD application/process. [facility] is prepared to support DC home when deemed appropriate by the Heart Failure Team.    If patient discharges after hours, please call [phone].   Larry Sierras 10/27/2014, 11:38 AM

## 2014-10-27 NOTE — Progress Notes (Addendum)
Family Medicine Teaching Service appreciates care provided by CCU/cardiology team and will resume care once patient is deemed medically stable by them.   Leroy Paganini, MD PGY-1, [facility] 10/27/2014, 8:48 AM  FMTS Attending Note Patient seen and examined by me, discussed with resident team.  Patient reports exacerbation of his chronic sciatica and lower back pain, for which he has taken [place] 10/325, 2 to 3 tablets as frequently as three times daily in the past.  Agree with use of long-acting opioids for pain control.  He also has noticed relief with alprazolam for his anxiety. Is eagerly awaiting discharge when outpatient care plan in place for him to return to his home in [place]. Leroy Mayotte, MD

## 2014-10-27 NOTE — Progress Notes (Signed)
Patient Leroy Kennedy      DOB: 1951-11-17      MWU:[id number]   Palliative Medicine Team at [facility] Progress Note    Subjective: Sitting up in chair today.  Pain better controlled with PRN and scheduled oxycodone.  Anxiety doing better with increased xanax.  Wishes he could go out and smoke. Eating okay.  Moving bowels.      Filed Vitals:   10/27/14 1000  BP: 92/78  Pulse:   Temp:   Resp: 17   Physical exam: GEN: alert, NAD HEENT: [place], sclera anicteric EXT: warm, no edema   CBC    Component Value Date/Time   WBC 7.2 10/23/2014 0340   RBC 4.41 10/23/2014 0340   HGB 10.7* 10/23/2014 0340   HCT 34.4* 10/23/2014 0340   PLT 203 10/23/2014 0340   MCV 78.0 10/23/2014 0340   MCH 24.3* 10/23/2014 0340   MCHC 31.1 10/23/2014 0340   RDW 23.5* 10/23/2014 0340   LYMPHSABS 1.0 10/17/2014 1000   MONOABS 0.6 10/17/2014 1000   EOSABS 0.3 10/17/2014 1000   BASOSABS 0.0 10/17/2014 1000    CMP     Component Value Date/Time   NA 140 10/27/2014 0450   K 4.4 10/27/2014 0450   CL 104 10/27/2014 0450   CO2 24 10/27/2014 0450   GLUCOSE 92 10/27/2014 0450   BUN 21 10/27/2014 0450   CREATININE 0.68 10/27/2014 0450   CALCIUM 9.3 10/27/2014 0450   PROT 7.4 10/12/2014 1958   ALBUMIN 3.1* 10/12/2014 1958   AST 19 10/12/2014 1958   ALT 19 10/12/2014 1958   ALKPHOS 113 10/12/2014 1958   BILITOT 0.4 10/12/2014 1958   GFRNONAA >90 10/27/2014 0450   GFRAA >90 10/27/2014 0450      Assessment and plan: 63 yo male with PMHx diastolic HF, COPD, tobacco abuse who presented with cardiogenic shock. Ongoing need for inotropic support. Palliative consulted goals of care.   1. Code Status- DNR   2. Goals of Care-care management working on home dobutamine arrangements.  PT has signed off because of low BP limiting ability to participate.  Leroy Kennedy still struggling emotionally with his prognosis and functional limitation. His goals may clarify based on how he is doing at home. While home  dobutamine will not be done with home hospice, I think he would benefit from outpatient palliative care following him at home if can be arranged. [facility] is the agency that seems to be able to provide most of outpatient palliative care follow-up.    3. Symptom Management  A.- Anxiety-xanax 1mg  TID. Being able to smoke again will likely help when he is at home.   B. Pain- chronic back pain. Better with PRN oxycodone switch. Cont regimen.   Doran Clay D.O. Palliative Medicine Team at [facility]  Pager: [phone] Team Phone: [phone]

## 2014-10-27 NOTE — Progress Notes (Signed)
Patient ID: Leroy Kennedy, male   DOB: Dec 28, 1951, 63 y.o.   MRN: [id number]  Advanced Heart Failure Rounding Note   Subjective:    Leroy Kennedy is a 63 yo male with a history of chronic systolic HF, COPD and tobacco abuse.   Underwent cardiac cath in 2013 in [place] after EF found to be 35%. Says cath showed normal arteries.   Moved from [place] to [place] this month after being homeless and having difficult life. Admitted with cardiogenic shock and 40 pound weight loss.   Echo with EF 20-25%, anteroseptal akinesis, moderate MR, RV dysfunction.  Co-ox 43% Started on milrinone and IV lasix. Milrinone stopped due to ongoing hypotension. Has been dependent on dobutamine and levophed to maintain BP.  10/22 moved to [facility] ICU and had RHC/LHC with swan left in place.   RHC/LHC 10/20/14 EF 30-35% Mod non obstructive CAD  Interval history: Midodrine increased to 10 mg TID and SBP better 80-100s. Remains on dobutamine 3 mcg. Co-ox 61%. Taking xanax 1 mg TID and requiring a fair amount of pain medications. Renal function stable. Has not gotten out of bed and PT signed off.    CT Chest: negative for PE, 19 mm mass in LLL concerning for neoplasm Bilateral LE doppler: no DVT   Objective:   Weight Range:  Vital Signs:   Temp:  [96.8 F (36 C)-97.9 F (36.6 C)] 97.5 F (36.4 C) (10/29 0300) Pulse Rate:  [86-95] 95 (10/29 0300) Resp:  [9-24] 17 (10/29 0600) BP: (74-111)/(50-91) 110/81 mmHg (10/29 0600) SpO2:  [95 %-98 %] 96 % (10/29 0300) Weight:  [131 lb 2.8 oz (59.5 kg)] 131 lb 2.8 oz (59.5 kg) (10/29 0300) Last BM Date: 10/25/14  Weight change: Filed Weights   10/25/14 0400 10/26/14 0335 10/27/14 0300  Weight: 133 lb 9.6 oz (60.6 kg) 133 lb 9.6 oz (60.6 kg) 131 lb 2.8 oz (59.5 kg)    Intake/Output:   Intake/Output Summary (Last 24 hours) at 10/27/14 0726 Last data filed at 10/27/14 0500  Gross per 24 hour  Intake  823.4 ml  Output   2400 ml  Net -1576.6 ml     Physical Exam:   General: Chronically appearing.  NAD, anxious HEENT: normal except for poor dentition and temporal wasting  Neck: supple. Carotids 2+ bilat; no bruits. No lymphadenopathy or thryomegaly appreciated. RIJ swan  Cor: PMI laterally displaced. ST. 2/6 MR  Lungs: clear, Abdomen: soft, nontender, nondistended. No hepatosplenomegaly. No bruits or masses. Good bowel sounds.  Extremities: no cyanosis, clubbing, rash, no edema, R UE 2L PICC Neuro: alert & orientedx3, cranial nerves grossly intact. moves all 4 extremities w/o difficulty. Affect pleasant  Telemetry: ST 110  Labs: Basic Metabolic Panel:  Recent Labs Lab 10/23/14 0340 10/24/14 0530 10/25/14 0515 10/26/14 0430 10/27/14 0450  NA 137 139 140 140 140  K 4.2 4.4 4.2 4.7 4.4  CL 102 103 103 104 104  CO2 _0 GLUCOSE 114* 84 138* 100* 92  BUN _1 24* 21  CREATININE 0.59 0.62 0.68 0.78 0.68  CALCIUM 8.7 9.2 8.9 9.5 9.3    Liver Function Tests: No results found for this basename: AST, ALT, ALKPHOS, BILITOT, PROT, ALBUMIN,  in the last 168 hours No results found for this basename: LIPASE, AMYLASE,  in the last 168 hours No results found for this basename: AMMONIA,  in the last 168 hours  CBC:  Recent Labs Lab 10/21/14 0500 10/23/14 0340  WBC 10.7* 7.2  HGB 11.9* 10.7*  HCT 37.9* 34.4*  MCV 77.8* 78.0  PLT 276 203    Cardiac Enzymes: No results found for this basename: CKTOTAL, CKMB, CKMBINDEX, TROPONINI,  in the last 168 hours  BNP: BNP (last 3 results)  Recent Labs  10/12/14 1316  PROBNP 20243.0*     Other results:    Imaging: No results found.   Medications:     Scheduled Medications: . aspirin EC  81 mg Oral Daily  . atorvastatin  80 mg Oral q1800  . digoxin  0.125 mg Oral Daily  . enoxaparin (LOVENOX) injection  40 mg Subcutaneous Q24H  . furosemide  20 mg Oral Q48H  . lidocaine  1 patch Transdermal Q24H  . midodrine  10 mg Oral TID WC  . oxyCODONE-acetaminophen  1 tablet  Oral Q8H  . pantoprazole  40 mg Oral Daily  . senna-docusate  1 tablet Oral QHS  . sodium chloride  10-40 mL Intracatheter Q12H    Infusions: . sodium chloride 7 mL/hr at 10/26/14 1700  . sodium chloride Stopped (10/26/14 1700)  . DOBUTamine 3 mcg/kg/min (10/27/14 0500)  . norepinephrine (LEVOPHED) Adult infusion Stopped (10/25/14 0800)    PRN Medications: acetaminophen, ALPRAZolam, diphenhydrAMINE-zinc acetate, ondansetron (ZOFRAN) IV, oxyCODONE, sodium chloride   Assessment:   1) Cardiogenic shock 2) A/C systolic HF: Nonischemic cardiomyopathy.  - EF 20-25% with anteroseptal akinesis and RV dysfunction, moderate MR.   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  3) COPD  4) LLL mass, ?neoplasm 5) Unintentional weight loss  6) Acute respiratory failure  7) Cachexia  8) Iron deficient anemia 9) Hyponatremia   Plan/Discussion:    Leroy Kennedy remains very tenuous. Has met with Palliative Care and has decided to be a DNR and is still trying to make a decision about whether Hospice or continue aggressive medical treatment. Very unfortunate situation and has had a very difficult life.   He is requiring xanax 1 mg TID and 10 mg oxycodone q 8 hrs. Concerned that we are not going to be able to get him home and keep him comfortable.   SBP better on midodrine 10 mg TID and dobutamine 3 mcg through PICC. BP too soft for ACE or b-blocker.   PT signed off because of low BP. He has not been out of bed since he has been here. Asked nursing staff to see if he could get up today and see if BP could tolerate.   We may need to consider discontinuing dobutamine and transitioning to more symptom management approach.   Rande Brunt, NP-C 7:26 AM   Patient seen and examined with Junie Bame, NP. We discussed all aspects of the encounter. I agree with the assessment and plan as stated above.   BP improved on high-dose midodrine.  He wants to go home on dobutamine. He doesn't want hospice at this point. I think he likely can go home on dobutamine 3 mcg/kg/min tomorrow with [facility] following (if we can get coverage for it). Will ask Primary team to reassume care and help coordinate his pain and anxiety medicine regimen.   Cardiac meds on d/c will be Midodrine 10 bid Digoxin 0.125 daily Dobutamine 3 mcg/kg/min  Lasix 40 mg daily for weight of 133 or greater.    Daniel Bensimhon,MD 10:29 AM

## 2014-10-28 LAB — BASIC METABOLIC PANEL
ANION GAP: 11 (ref 5–15)
BUN: 20 mg/dL (ref 6–23)
CHLORIDE: 106 meq/L (ref 96–112)
CO2: 25 meq/L (ref 19–32)
Calcium: 9.4 mg/dL (ref 8.4–10.5)
Creatinine, Ser: 0.65 mg/dL (ref 0.50–1.35)
GFR calc Af Amer: >90 mL/min (ref >90)
GFR calc non Af Amer: >90 mL/min (ref >90)
Glucose, Bld: 103 mg/dL — ABNORMAL HIGH (ref 70–99)
POTASSIUM: 4 meq/L (ref 3.7–5.3)
SODIUM: 142 meq/L (ref 137–147)

## 2014-10-28 LAB — CARBOXYHEMOGLOBIN
Carboxyhemoglobin: 1.7 % — ABNORMAL HIGH (ref 0.5–1.5)
METHEMOGLOBIN: 0.7 % (ref 0.0–1.5)
O2 Saturation: 71 %
TOTAL HEMOGLOBIN: 10.9 g/dL — AB (ref 13.5–18.0)

## 2014-10-28 MED ORDER — DIGOXIN 125 MCG PO TABS: 0.1250 mg | ORAL_TABLET | Freq: Every day | ORAL | Status: AC

## 2014-10-28 MED ORDER — HEPARIN SOD (PORK) LOCK FLUSH 100 UNIT/ML IV SOLN: 250.0000 [IU] | INTRAVENOUS | Status: DC | PRN

## 2014-10-28 MED ORDER — SENNOSIDES-DOCUSATE SODIUM 8.6-50 MG PO TABS: 1.0000 | ORAL_TABLET | Freq: Every day | ORAL | Status: AC

## 2014-10-28 MED ORDER — OXYCODONE-ACETAMINOPHEN 5-325 MG PO TABS: 1.0000 | ORAL_TABLET | Freq: Three times a day (TID) | ORAL | Status: AC

## 2014-10-28 MED ORDER — ATORVASTATIN CALCIUM 80 MG PO TABS: 80.0000 mg | ORAL_TABLET | Freq: Every day | ORAL | Status: AC

## 2014-10-28 MED ORDER — FUROSEMIDE 40 MG PO TABS: 40.0000 mg | ORAL_TABLET | Freq: Every day | ORAL | Status: AC | PRN

## 2014-10-28 MED ORDER — HEPARIN SOD (PORK) LOCK FLUSH 100 UNIT/ML IV SOLN
250.0000 [IU] | INTRAVENOUS | Status: AC | PRN
  Administered 2014-10-28: 250 [IU]

## 2014-10-28 MED ORDER — OXYCODONE HCL 5 MG PO TABS: 5.0000 mg | ORAL_TABLET | ORAL | Status: AC | PRN

## 2014-10-28 MED ORDER — ALPRAZOLAM 1 MG PO TABS: 1.0000 mg | ORAL_TABLET | Freq: Three times a day (TID) | ORAL | Status: AC | PRN

## 2014-10-28 MED ORDER — MIDODRINE HCL 10 MG PO TABS: 10.0000 mg | ORAL_TABLET | Freq: Three times a day (TID) | ORAL | Status: AC

## 2014-10-28 MED ORDER — ASPIRIN 81 MG PO TBEC: 81.0000 mg | DELAYED_RELEASE_TABLET | Freq: Every day | ORAL | Status: AC

## 2014-10-28 MED ORDER — DOBUTAMINE IN D5W 4-5 MG/ML-% IV SOLN: 3.0000 ug/kg/min | INTRAVENOUS | Status: AC

## 2014-10-28 NOTE — Progress Notes (Signed)
Patient ID: Leroy Kennedy, male   DOB: [birth year], 63 y.o.   MRN: [id number]  Advanced Heart Failure Rounding Note   Subjective:    Mr. Konicek is a 63 yo male with a history of chronic systolic HF, COPD and tobacco abuse.   Underwent cardiac cath in 2013 in [place] after EF found to be 35%. Says cath showed normal arteries.   Moved from [place] to [place] this month after being homeless and having difficult life. Admitted with cardiogenic shock and 40 pound weight loss.   Echo with EF 20-25%, anteroseptal akinesis, moderate MR, RV dysfunction.  Co-ox 43% Started on milrinone and IV lasix. Milrinone stopped due to ongoing hypotension. Has been dependent on dobutamine and levophed to maintain BP.  10/22 moved to [facility] ICU and had RHC/LHC with swan left in place.   RHC/LHC 10/20/14 EF 30-35% Mod non obstructive CAD  Remains on midodrine 10 mg TID and dobutamine 3 mcg through PICC. SBP 70-90s. Remains on lots on anxiety medications and pain medications. OOB yesterday just in the room and was very unsteady. Co-ox 71%. Renal function stable.    CT Chest: negative for PE, 19 mm mass in LLL concerning for neoplasm Bilateral LE doppler: no DVT   Objective:   Weight Range:  Vital Signs:   Temp:  [97.5 F (36.4 C)-98.2 F (36.8 C)] 97.8 F (36.6 C) (10/30 0300) Pulse Rate:  [80-105] 84 (10/30 0300) Resp:  [15-24] 16 (10/30 0300) BP: (76-138)/(52-96) 76/57 mmHg (10/30 0300) SpO2:  [96 %-97 %] 96 % (10/30 0300) Weight:  [130 lb 1.1 oz (59 kg)] 130 lb 1.1 oz (59 kg) (10/30 0300) Last BM Date: 10/26/14  Weight change: Filed Weights   10/26/14 0335 10/27/14 0300 10/28/14 0300  Weight: 133 lb 9.6 oz (60.6 kg) 131 lb 2.8 oz (59.5 kg) 130 lb 1.1 oz (59 kg)    Intake/Output:   Intake/Output Summary (Last 24 hours) at 10/28/14 0740 Last data filed at 10/28/14 0700  Gross per 24 hour  Intake  975.3 ml  Output   1975 ml  Net -999.7 ml     Physical Exam:  General: Chronically appearing.   NAD, lying flat in bed HEENT: normal except for poor dentition and temporal wasting  Neck: supple. Carotids 2+ bilat; no bruits. No lymphadenopathy or thryomegaly appreciated. RIJ swan  Cor: PMI laterally displaced. 2/6 MR  Lungs: clear, Abdomen: soft, nontender, nondistended. No hepatosplenomegaly. No bruits or masses. Good bowel sounds.  Extremities: no cyanosis, clubbing, rash, no edema, R UE 2L PICC Neuro: alert & orientedx3, cranial nerves grossly intact. moves all 4 extremities w/o difficulty. Affect pleasant  Telemetry: SR 90s  Labs: Basic Metabolic Panel:  Recent Labs Lab 10/24/14 0530 10/25/14 0515 10/26/14 0430 10/27/14 0450 10/28/14 0427  NA 139 140 140 140 142  K 4.4 4.2 4.7 4.4 4.0  CL 103 103 104 104 106  CO2 26 24 25 24 25   GLUCOSE 84 138* 100* 92 103*  BUN 17 21 24* 21 20  CREATININE 0.62 0.68 0.78 0.68 0.65  CALCIUM 9.2 8.9 9.5 9.3 9.4    Liver Function Tests: No results found for this basename: AST, ALT, ALKPHOS, BILITOT, PROT, ALBUMIN,  in the last 168 hours No results found for this basename: LIPASE, AMYLASE,  in the last 168 hours No results found for this basename: AMMONIA,  in the last 168 hours  CBC:  Recent Labs Lab 10/23/14 0340  WBC 7.2  HGB 10.7*  HCT 34.4*  MCV  78.0  PLT 203    Cardiac Enzymes: No results found for this basename: CKTOTAL, CKMB, CKMBINDEX, TROPONINI,  in the last 168 hours  BNP: BNP (last 3 results)  Recent Labs  10/12/14 1316  PROBNP 20243.0*     Other results:    Imaging: No results found.   Medications:     Scheduled Medications: . aspirin EC  81 mg Oral Daily  . atorvastatin  80 mg Oral q1800  . digoxin  0.125 mg Oral Daily  . enoxaparin (LOVENOX) injection  40 mg Subcutaneous Q24H  . lidocaine  1 patch Transdermal Q24H  . midodrine  10 mg Oral TID WC  . oxyCODONE-acetaminophen  1 tablet Oral Q8H  . pantoprazole  40 mg Oral Daily  . senna-docusate  1 tablet Oral QHS  . sodium chloride   10-40 mL Intracatheter Q12H    Infusions: . sodium chloride 7 mL/hr at 10/28/14 0700  . DOBUTamine 3 mcg/kg/min (10/28/14 0700)  . norepinephrine (LEVOPHED) Adult infusion Stopped (10/25/14 0800)    PRN Medications: acetaminophen, ALPRAZolam, diphenhydrAMINE-zinc acetate, ondansetron (ZOFRAN) IV, oxyCODONE, sodium chloride   Assessment:   1) Cardiogenic shock 2) A/C systolic HF: Nonischemic cardiomyopathy.  - EF 20-25% with anteroseptal akinesis and RV dysfunction, moderate MR.   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  3) COPD  4) LLL mass, ?neoplasm 5) Unintentional weight loss  6) Acute respiratory failure  7) Cachexia  8) Iron deficient anemia 9) Hyponatremia   Plan/Discussion:    Mr. Eltringham remains very tenuous. Has met with Palliative Care and has decided to be a DNR and has now decided he does not want Hospice and wants to go home. I am very concerned about getting him home d/t his SBP is still very soft and has not walked in the halls yet. Have asked him to walk in the halls today which he says he can do. If he can walk and is stable could go home today.   Will continue dobutamine 3 mcg through PICC and will go home with [facility] and PT.   Big issue now is his requirement for anxiolytics and pain medication. He is going to need to get plugged in with someone in the community to help manage these conditions and prescribe these medications. Will reach out to Rosanne Sack with palliative care to see if she could see the patient.   Cardiac meds on d/c will be Midodrine 10 TID Digoxin 0.125 daily Dobutamine 3 mcg/kg/min  Lasix 40 mg daily for weight of 133 or greater.    Rande Brunt, NP-C 7:40 AM  Patient seen and examined with Junie Bame, NP. We discussed all aspects of the encounter. I agree with the assessment and plan as stated above.    Agree that he is very tenuous but from cardiac  perspective this is about as good as we can get him. Can go home today if felt stable from an ambulation standpoint. We will reach out to Rosanne Sack with [place] to help with Palliative services.  Daniel Bensimhon,MD 8:28 AM

## 2014-10-28 NOTE — Evaluation (Signed)
Physical Therapy Evaluation Patient Details Name: Leroy Kennedy MRN: [id number] DOB: [birth year] Today's Date: 10/28/2014   History of Present Illness  Leroy Kennedy is a 63 y.o. male presenting with a cc of SOB/cough, and sternal CP . PMH is significant for CHF with subjectively reported EF of 35%, COPD, Depression, and back pain.  Most likely d/t CHF exacerbation with cardiogenic shock. Pt was transferred to ICU with continued low pressures. Pt also found to have lung mass. Palliative care involved. Pt to go home on dobutamine.  Clinical Impression  Pt admitted with above. Pt currently with functional limitations due to the deficits listed below (see PT Problem List).  Pt will benefit from skilled PT to increase their independence and safety with mobility to allow discharge to the venue listed below. Feel pt can return home with rolling walker and intermittent supervision.      Follow Up Recommendations Home health PT;Supervision - Intermittent    Equipment Recommendations  Rolling walker with 5" wheels    Recommendations for Other Services       Precautions / Restrictions Precautions Precautions: Fall Precaution Comments: low BP's but not sympotomatic Restrictions Weight Bearing Restrictions: No      Mobility  Bed Mobility Overal bed mobility: Modified Independent Bed Mobility: Supine to Sit;Sit to Supine     Supine to sit: Modified independent (Device/Increase time) Sit to supine: Modified independent (Device/Increase time)      Transfers Overall transfer level: Modified independent Equipment used: Rolling walker (2 wheeled) Transfers: Sit to/from Stand Sit to Stand: Modified independent (Device/Increase time)            Ambulation/Gait Ambulation/Gait assistance: Supervision Ambulation Distance (Feet): 150 Feet Assistive device: Rolling walker (2 wheeled) Gait Pattern/deviations: Step-through pattern;Decreased step length - right;Decreased step length - left    Gait velocity interpretation: Below normal speed for age/gender General Gait Details: Steady gait when using walker.  Stairs            Wheelchair Mobility    Modified Rankin (Stroke Patients Only)       Balance Overall balance assessment: Needs assistance Sitting-balance support: No upper extremity supported;Feet supported Sitting balance-Leahy Scale: Good       Standing balance-Leahy Scale: Fair                               Pertinent Vitals/Pain Pain Assessment: No/denies pain    Home Living Family/patient expects to be discharged to:: Private residence Living Arrangements: Non-relatives/Friends Available Help at Discharge: Friend(s);Available PRN/intermittently Type of Home: House Home Access: Stairs to enter   [employer] of Steps: 2 Home Layout: One level Home Equipment: None      Prior Function Level of Independence: Independent               Hand Dominance        Extremity/Trunk Assessment   Upper Extremity Assessment: Generalized weakness           Lower Extremity Assessment: Generalized weakness      Cervical / Trunk Assessment: Kyphotic  Communication   Communication: No difficulties  Cognition Arousal/Alertness: Awake/alert Behavior During Therapy: WFL for tasks assessed/performed Overall Cognitive Status: Within Functional Limits for tasks assessed                      General Comments      Exercises        Assessment/Plan    PT Assessment Patient  needs continued PT services  PT Diagnosis Difficulty walking;Generalized weakness   PT Problem List Decreased strength;Decreased activity tolerance;Decreased balance;Decreased mobility  PT Treatment Interventions DME instruction;Functional mobility training;Therapeutic activities;Therapeutic exercise;Patient/family education;Gait training;Balance training   PT Goals (Current goals can be found in the Care Plan section) Acute Rehab PT  Goals Patient Stated Goal: return home PT Goal Formulation: With patient Time For Goal Achievement: 11/04/14 Potential to Achieve Goals: Good    Frequency Min 3X/week   Barriers to discharge        Co-evaluation               End of Session Equipment Utilized During Treatment: Gait belt Activity Tolerance: Patient tolerated treatment well Patient left: in bed;with call bell/phone within reach Nurse Communication: Mobility status         Time: 0034-9179 PT Time Calculation (min): 15 min   Charges:   PT Evaluation $Initial PT Evaluation Tier I: 1 Procedure PT Treatments $Gait Training: 8-22 mins   PT G Codes:          Camie Hauss October 29, 2014, 12:19 PM  [facility] PT [phone]

## 2014-10-28 NOTE — Progress Notes (Signed)
10/28/2014 1400 [facility] notified RW needed for home. Jonnie Finner RN Case Mgmt phone [phone]

## 2014-10-28 NOTE — Progress Notes (Signed)
CARE MANAGEMENT NOTE 10/28/2014  Patient:  Leroy Kennedy, Leroy Kennedy   Account Number:  [id number]  Date Initiated:  10/12/2014  Documentation initiated by:  Kennedy,Leroy  Subjective/Objective Assessment:   CHF     Action/Plan:   CM to follow for disposition needs   Anticipated DC Date:     Anticipated DC Plan:  [place] referral  Clinical Social Worker      DC Forensic scientist  CM consult  [facility]      [facility] Choice  [place]   Choice offered to / List presented to:  C-1 Patient   DME arranged  IV PUMP/EQUIPMENT      DME agency  [place] arranged  HH-1 RN  [place].   Status of service:  Completed, signed off Medicare Important Message given?   (If response is "NO", the following Medicare IM given date fields will be blank) Date Medicare IM given:   Medicare IM given by:   Date Additional Medicare IM given:   Additional Medicare IM given by:    Discharge Disposition:  [place]  Per UR Regulation:  Reviewed for med. necessity/level of care/duration of stay  If discussed at [place] of Stay Meetings, dates discussed:   10/18/2014  10/20/2014  10/25/2014  10/27/2014    Comments:  10/28/2014 1015 NCM spoke to Leroy Kennedy rep, Leroy Kennedy and they have IV Dobutamine arranged for home. Contacted caregiver's son, Leroy Kennedy # [phone] and states pt and cargiver lives in his home. He did not have an updated contact number for pt. Caregiver, Leroy Kennedy plans to pick up pt at dc. Will speak to her at that time for updated information. Pt states he has insurance but not sure of name, [employer]. NCM contacted and was provided inof on [facility], pt Member ID # [id number], contact # [phone], plan under [employer]. Contacted Ed registration and they will look up info. Leroy Finner RN  CCM Case Mgmt phone [phone]  10/29  1042 Leroy dowell rn,bsn spoke w Leroy Kennedy w [facility]. they are prepared to do home dobutamine and aware maybe dc on 10-30. pt has applied for medicaid but [facility] will take at disch.  10/21  1011 Leroy dowell rn,bsn spoke w pt and went over hhc agency list. no pref. he prefers to go home and not snf but phy ther has rec snf. have made ref to Leroy Kennedy w [facility] for home iv dobutamine. he states he has medicare. he states he has someone with him 24/7 at home.  10/15  1524 Leroy dowell rn,bsn spoke w pt. he just moved here from [place]. states he has ins. did leave pt [place] [facility] list and explained about [place] [facility]. will cont to follow.  Leroy Hutchinson RN, BSN, MSHL, CCM  Nurse - Case Manager,  (Unit [phone]  10/12/2014 Social:  Hx/o moved to [place] within the past week from [place]:  Clarification needed CM consult pending.

## 2014-10-28 NOTE — Discharge Instructions (Signed)
You were admitted in cardiogenic shock.  You require several medications to keep your blood pressure up.  Your home medications are listed below.  For the lasix, only take a pill on days when your weight is 133 or higher.   Cardiogenic Shock When the heart does not pump hard enough, blood does not flow well through the body. In severe cases, a poorly pumping heart cannot deliver enough oxygen and nutrients to the lungs, brain, kidneys and other vital organs. This is a condition called "shock." When shock is caused by a problem with the heart, the condition is called cardiogenic shock. This is a serious condition. It occurs in up to 10 percent of people who have had a heart attack. CAUSES  The most common cause is a heart attack. A heart attack can leave part of your heart unable to pump blood effectively. This can cause your blood pressure to be very low. Other possible causes of cardiogenic shock are:  Fluid that builds up around the heart and puts pressure on the heart.  A tear in the heart muscle, which changes the way your blood flows.  A weak heart that becomes even more weak (cardiomyopathy).  Heart failure. The heart is not able to pump blood efficiently. Fluid can leak into the lungs or cause too much fluid to build up in the veins. In severe cases, this can lead to cardiogenic shock.  Worsening problems with your heart valves, especially if you have aortic stenosis (when the valve between the heart and aorta, the main artery, does not open fully).  Medications such as beta-blockers and calcium channel blockers or drugs that are toxic to the heart. An example would be the cancer drug doxorubicin.  A growing tumor in the heart (myxoma).  Infection of the heart muscle (myocarditis).  A blood clot that blocks the artery that takes blood from the heart to the lungs (pulmonary embolism). SYMPTOMS  The major signs and symptoms of cardiogenic shock are:  Low blood  pressure.  Sweating.  Urinating less often than normal.  Feeling nauseous (like you need to vomit).  Fainting.  Weakness.  Pale skin.  Cool to cold hands or feet.  Shallow, quick breathing.  Confusion. DIAGNOSIS   Your blood pressure and other "vital signs" will be taken. You are in shock if the blood pressure is too low (if the top number in the reading is less than 90). Measurements of your pulse (how fast the heart is beating) and your breathing rate are also important.  Your appearance will be examined. Being sweaty, pale or having skin with a blue tint is a sign of possible shock.  Blood tests might be done to find out whether you have had a heart attack.  You will be given an electrocardiogram (called an ECG or EKG) to look for signs of a heart attack.  A chest X-ray might be used to see if fluid has built up in your heart or lungs or if your heart is enlarged. TREATMENT  Treating cardiogenic shock depends on getting blood flowing again in your body, especially in the part of the heart that is damaged. Treatment will take place in the emergency room and intensive care unit of the hospital. It will include:  Medication.  Surgery to get blood flowing again by creating new, additional or improved blood vessels (revascularization).  A procedure that uses a tiny surgical balloon to open arteries and improve blood flow (angioplasty).  Surgery to fix any problems with  the heart's structure.  A heart transplant could be recommended, but this is rare. PROGNOSIS  There are excellent treatments available for those with cardiogenic shock. However, this is a serious, life-threatening condition. Fast treatment in an emergency medical facility is very important. Factors that affect survival odds include:  How fast you are treated for shock.  How fast the healthcare team can restore the blood vessel supply to your heart (revascularization).  Your age.  Past problems with your  heart.  How healthy you are overall.  Any other health conditions you might have. RELATED COMPLICATIONS Problems that can result from having cardiogenic shock include:  Infection.  Repeated surgeries.  A long recovery time.  Long-term limitations on activities and exercise.  Death. HOME CARE INSTRUCTIONS  When you leave the hospital after a heart attack with cardiogenic shock, you will need to:  Take part in rehabilitation programs recommended by your caregiver.  Take the medications that your caregiver has prescribed.  Gradually increase your physical activity over the next 6 weeks. Work with a rehabilitation program or supervised exercise program to make sure you have the right activities.  Return to your caregiver for follow-up appointments or blood tests.  Limit driving for at least the first week. Talk with your caregiver before you start driving again.  For a month, avoid lifting items that weigh more than 10 pounds.  For the first 6 weeks after a heart attack, limit your activities somewhat. However, light housework, hobbies or crafts, shopping or eating out, and light walking will likely be OK.  Talk to your caregiver before you do something that requires a lot of arm use, such as hammering, shoveling snow or lifting anything heavier than 30 pounds.  Get a medical ID bracelet or necklace in case of emergencies.  Quit smoking.  Start eating healthy. Your caregiver might ask you to lessen the amount of fat or salt in your diet. SEEK MEDICAL CARE IF:   You have signs of depression. This is common after a heart attack.  You occasionally feel dizzy or lightheaded.  You feel occasional palpitations (any unpleasant awareness of your heartbeat) or an irregular heartbeat.  Your arms or legs start to swell.  You feel that you are having problems with medicines prescribed. SEEK IMMEDIATE MEDICAL CARE IF:   You have the signs and symptoms of shock.  You have heart  attack symptoms:  Pain in the center of your chest.  A sense of your chest being squeezed.  Pain in your arms, neck, shoulders or jaw.  Shortness of breath, even if you have no chest discomfort.  Breaking out in a cold sweat.  Nausea.  Lightheadedness.  You feel dizzy often.  Your heart beats fast or unevenly for more than several minutes.  You lose consciousness. Document Released: 09/24/2008 Document Revised: 03/09/2012 Document Reviewed: 09/24/2008 [facility] Patient Information 2015 [place], [place]. This information is not intended to replace advice given to you by your health care provider. Make sure you discuss any questions you have with your health care provider.

## 2014-11-04 ENCOUNTER — Encounter (HOSPITAL_COMMUNITY): Payer: Self-pay

## 2014-11-04 ENCOUNTER — Ambulatory Visit (HOSPITAL_COMMUNITY)
Admit: 2014-11-04 | Discharge: 2014-11-04 | Disposition: A | Discharge status: Home / Self Care | Payer: Medicaid Other | Source: Ambulatory Visit | Attending: Cardiology | Admitting: Cardiology

## 2014-11-04 VITALS — BP 114/70 | HR 109 | Resp 18 | Wt 142.5 lb

## 2014-11-04 DIAGNOSIS — M549 Dorsalgia, unspecified: Secondary | ICD-10-CM

## 2014-11-04 DIAGNOSIS — Z66 Do not resuscitate: Secondary | ICD-10-CM | POA: Insufficient documentation

## 2014-11-04 DIAGNOSIS — J449 Chronic obstructive pulmonary disease, unspecified: Secondary | ICD-10-CM | POA: Diagnosis not present

## 2014-11-04 DIAGNOSIS — F411 Generalized anxiety disorder: Secondary | ICD-10-CM

## 2014-11-04 DIAGNOSIS — I5022 Chronic systolic (congestive) heart failure: Secondary | ICD-10-CM | POA: Insufficient documentation

## 2014-11-04 DIAGNOSIS — G8929 Other chronic pain: Secondary | ICD-10-CM

## 2014-11-04 DIAGNOSIS — E46 Unspecified protein-calorie malnutrition: Secondary | ICD-10-CM | POA: Diagnosis not present

## 2014-11-04 DIAGNOSIS — I2510 Atherosclerotic heart disease of native coronary artery without angina pectoris: Secondary | ICD-10-CM | POA: Insufficient documentation

## 2014-11-04 DIAGNOSIS — F419 Anxiety disorder, unspecified: Secondary | ICD-10-CM | POA: Diagnosis not present

## 2014-11-04 DIAGNOSIS — Z598 Other problems related to housing and economic circumstances: Secondary | ICD-10-CM

## 2014-11-04 DIAGNOSIS — E43 Unspecified severe protein-calorie malnutrition: Secondary | ICD-10-CM

## 2014-11-04 DIAGNOSIS — Z5987 Material hardship: Secondary | ICD-10-CM

## 2014-11-04 NOTE — Patient Instructions (Signed)
Follow up in 2 weeks.  Doing well.  Make sure you are taking medications everyday.  Bring all your medications to your appointments.  Call any issues [phone]  Make sure you take lasix 40 mg daily.  Do the following things EVERYDAY: 1) Weigh yourself in the morning before breakfast. Write it down and keep it in a log. 2) Take your medicines as prescribed 3) Eat low salt foods-Limit salt (sodium) to 2000 mg per day.  4) Stay as active as you can everyday 5) Limit all fluids for the day to less than 2 liters 6)

## 2014-11-04 NOTE — Progress Notes (Signed)
Patient ID: Leroy Kennedy, male   DOB: Nov 15, 1951, 63 y.o.   MRN: [id number] PCP:  N/A Primary Cardiologist: Dr. Haroldine Laws  HPI: Leroy Kennedy is a 63 yo male with a history of chronic systolic HF, NICM, moderate non-obstructive CAD, moderate MR, COPD, LLL mass, anemia and malnutrition.  Patient recently moved from [place] and currently does not have any insurance. He was admitted to [facility] hospital with cardiogenic shock and A/C HF. Found to have lung mass. Underwent L/RHC showing non-obstructive CAD and low-output and was started on IV dobutamine. Did not tolerate wean and was started on midodrine 10 mg TID. He is DNR but denied Hospice. D/c weight 130 lbs.  [facility] Follow up for Heart Failure: Doing ok since being discharged. On Tuesday and Wednesday felt well but today having some dizziness and SOB. Walking around the house some and has walked to mailbox once with no issues. Not weighing daily. Denies CP or LE edema. Appetite pretty good. Still having back pain and anxiety. Following a low salt diet and drinking less than 2L a day.    ROS: All systems negative except as listed in HPI, PMH and Problem List.  SH:  History   Social History  . Marital Status: Single    Spouse Name: N/A    Number of Children: N/A  . Years of Education: N/A   Occupational History  . Not on file.   Social History Main Topics  . Smoking status: Current Every Day Smoker -- 0.50 packs/day for 46 years    Types: Cigarettes  . Smokeless tobacco: Never Used  . Alcohol Use: 4.8 oz/week    8 Cans of beer per week  . Drug Use: No  . Sexual Activity: Not Currently   Other Topics Concern  . Not on file   Social History Narrative   Living in [place] with friends    FH:  Family History  Problem Relation Age of Onset  . Alzheimer's disease Mother     deceased  . Alzheimer's disease Father     deceased, HTN, back pain    Past Medical History  Diagnosis Date  . COPD (chronic obstructive pulmonary  disease)   . Hypertension   . High cholesterol   . Chronic bronchitis   . GERD (gastroesophageal reflux disease)   . History of blood transfusion 2010    "had a punctured lung that was full of bile"  . Stroke     "I've had a couple small strokes this year; left me a little weak in RLE"  . Arthritis     "all over"  . Bulging lumbar disc   . CHF (congestive heart failure) dx'd 03/2012    Current Outpatient Prescriptions  Medication Sig Dispense Refill  . ALPRAZolam (XANAX) 1 MG tablet Take 1 tablet (1 mg total) by mouth 3 (three) times daily as needed for anxiety (AIR HUNGER). 30 tablet 0  . aspirin EC 81 MG EC tablet Take 1 tablet (81 mg total) by mouth daily. 30 tablet 0  . atorvastatin (LIPITOR) 80 MG tablet Take 1 tablet (80 mg total) by mouth daily at 6 PM. 30 tablet 0  . digoxin (LANOXIN) 0.125 MG tablet Take 1 tablet (0.125 mg total) by mouth daily. 30 tablet 0  . DOBUTamine (DOBUTREX) 4-5 MG/ML-% infusion Inject 178.2 mcg/min into the vein continuous. 250 mL 0  . furosemide (LASIX) 40 MG tablet Take 1 tablet (40 mg total) by mouth daily as needed (if weight 133lbs or greater). [place]  tablet 0  . midodrine (PROAMATINE) 10 MG tablet Take 1 tablet (10 mg total) by mouth 3 (three) times daily with meals. 90 tablet 0  . omeprazole (PRILOSEC) 20 MG capsule Take 20 mg by mouth daily.    Marland Kitchen oxyCODONE (OXY IR/ROXICODONE) 5 MG immediate release tablet Take 1 tablet (5 mg total) by mouth every 3 (three) hours as needed for moderate pain, severe pain or breakthrough pain. 60 tablet 0  . oxyCODONE-acetaminophen (PERCOCET/ROXICET) 5-325 MG per tablet Take 1 tablet by mouth every 8 (eight) hours. 90 tablet 0  . senna-docusate (SENOKOT-S) 8.6-50 MG per tablet Take 1 tablet by mouth at bedtime. 30 tablet 0   No current facility-administered medications for this encounter.    Filed Vitals:   11/04/14 0841  BP: 114/70  Pulse: 109  Resp: 18  Weight: 142 lb 8 oz (64.638 kg)  SpO2: 97%    PHYSICAL  EXAM: General:  Chronically ill appearing. No resp difficulty. In wheelchair; friends present HEENT: normal Neck: supple. JVP 8. Carotids 2+ bilaterally; no bruits. No lymphadenopathy or thryomegaly appreciated. Cor: PMI laterally displaced. RRR. +s3. 2/6 MR Lungs: clear Abdomen: soft, nontender, nondistended. No hepatosplenomegaly. No bruits or masses. Good bowel sounds. Extremities: no cyanosis, clubbing, rash, edema, R 2L PICC Neuro: alert & orientedx3, cranial nerves grossly intact. Moves all 4 extremities w/o difficulty. Affect pleasant.  ASSESSMENT & PLAN:  1) Chronic systolic HF: EF 52-77%, moderate RV dysfx (Echo 09/2014); NICM - Patient recently admitted to the hospital for A/C HF and cardiogenic shock. He was started on dobutamine and he did not tolerate discontinuation. Suffered from hypotension and was discharged on midodrine. Discharge weight 130 lbs. Declined Hospice. - NYHA IIIb symptoms and volume status elevated. Not sure if patient has been taking medications accurately or not. Weight is up and have asked him to please take lasix 40 mg daily and his friends said they would help with medications. - Continue dobutamine 3 mcg through PICC. Weekly labs with [employer] RN.  - Not on BB with cardiogenic shock. - Continue digoxin 0.125 mcg daily. He is not on ACE-I or ARB with hypotension. - Continue midrodrine 10 mg TID. - Lengthy discussion with patient and caregivers about concern and prognosis. Discussed the need to take medications daily. I am very concerned about Leroy Kennedy and do not think he has long to live. Palliative Care met with patient in the hospital and he declined Hospice. He is a DNR. Biggest issue concerning to patient is pain medications and anxiety medications. He does not have PCP and will need to be set up with CSW next visit. Will try to see if Rosanne Sack from [facility] will see patient. - Prognosis guarded.  2) Moderate non-obstructive CAD - Cath in the  hospital (10/20/14). No s/s of ischemia. Continue ASA and statin 3) COPD - Will eventually need follow up with Pulmonary. Needs to get Insurance will have CSW work with patient 4) Anxiety/Pain issues - Patient has severe pain and anxiety issues. Continue Xanax and oxycodone and will need to be set up with pain management clinic if we can't help establish with PCP or Palliative Care.  5) Malnutrition - Has lost over 40 lbs in past 6 months. He has cachexia and also lung mass. Encouraged to continue to eat as much as possible.  6) DNR - Need to assess next visit and make sure he wants to remain DNR. If so will need [employer] Form.    Spent 40 minutes with  patient with at least 1/2 that time discussing the above issues.  F/U 2 weeks Junie Bame B NP-C 12:14 PM

## 2014-11-06 ENCOUNTER — Emergency Department (HOSPITAL_COMMUNITY): Payer: Medicaid Other

## 2014-11-06 ENCOUNTER — Encounter (HOSPITAL_COMMUNITY): Payer: Self-pay | Admitting: Emergency Medicine

## 2014-11-06 ENCOUNTER — Inpatient Hospital Stay (HOSPITAL_COMMUNITY)
Admission: EM | Admit: 2014-11-06 | Discharge: 2014-11-29 | DRG: 291 | Disposition: E | Payer: Medicaid Other | Attending: Family Medicine | Admitting: Family Medicine

## 2014-11-06 DIAGNOSIS — C3490 Malignant neoplasm of unspecified part of unspecified bronchus or lung: Secondary | ICD-10-CM | POA: Diagnosis present

## 2014-11-06 DIAGNOSIS — I9589 Other hypotension: Secondary | ICD-10-CM

## 2014-11-06 DIAGNOSIS — I509 Heart failure, unspecified: Secondary | ICD-10-CM

## 2014-11-06 DIAGNOSIS — R740 Nonspecific elevation of levels of transaminase and lactic acid dehydrogenase [LDH]: Secondary | ICD-10-CM | POA: Diagnosis present

## 2014-11-06 DIAGNOSIS — Z7982 Long term (current) use of aspirin: Secondary | ICD-10-CM

## 2014-11-06 DIAGNOSIS — Z79891 Long term (current) use of opiate analgesic: Secondary | ICD-10-CM

## 2014-11-06 DIAGNOSIS — E872 Acidosis, unspecified: Secondary | ICD-10-CM

## 2014-11-06 DIAGNOSIS — I5022 Chronic systolic (congestive) heart failure: Secondary | ICD-10-CM

## 2014-11-06 DIAGNOSIS — R000 Tachycardia, unspecified: Secondary | ICD-10-CM | POA: Diagnosis present

## 2014-11-06 DIAGNOSIS — K7290 Hepatic failure, unspecified without coma: Secondary | ICD-10-CM | POA: Insufficient documentation

## 2014-11-06 DIAGNOSIS — I471 Supraventricular tachycardia: Secondary | ICD-10-CM | POA: Diagnosis present

## 2014-11-06 DIAGNOSIS — I959 Hypotension, unspecified: Secondary | ICD-10-CM | POA: Diagnosis present

## 2014-11-06 DIAGNOSIS — J449 Chronic obstructive pulmonary disease, unspecified: Secondary | ICD-10-CM | POA: Diagnosis present

## 2014-11-06 DIAGNOSIS — Z8673 Personal history of transient ischemic attack (TIA), and cerebral infarction without residual deficits: Secondary | ICD-10-CM

## 2014-11-06 DIAGNOSIS — Z66 Do not resuscitate: Secondary | ICD-10-CM | POA: Diagnosis present

## 2014-11-06 DIAGNOSIS — K219 Gastro-esophageal reflux disease without esophagitis: Secondary | ICD-10-CM | POA: Diagnosis present

## 2014-11-06 DIAGNOSIS — R4020 Unspecified coma: Secondary | ICD-10-CM | POA: Diagnosis present

## 2014-11-06 DIAGNOSIS — F17210 Nicotine dependence, cigarettes, uncomplicated: Secondary | ICD-10-CM | POA: Diagnosis present

## 2014-11-06 DIAGNOSIS — I5023 Acute on chronic systolic (congestive) heart failure: Principal | ICD-10-CM | POA: Diagnosis present

## 2014-11-06 DIAGNOSIS — G894 Chronic pain syndrome: Secondary | ICD-10-CM | POA: Diagnosis present

## 2014-11-06 DIAGNOSIS — Z6820 Body mass index (BMI) 20.0-20.9, adult: Secondary | ICD-10-CM

## 2014-11-06 DIAGNOSIS — I429 Cardiomyopathy, unspecified: Secondary | ICD-10-CM | POA: Diagnosis present

## 2014-11-06 DIAGNOSIS — Z8249 Family history of ischemic heart disease and other diseases of the circulatory system: Secondary | ICD-10-CM

## 2014-11-06 DIAGNOSIS — M1990 Unspecified osteoarthritis, unspecified site: Secondary | ICD-10-CM | POA: Diagnosis present

## 2014-11-06 DIAGNOSIS — I10 Essential (primary) hypertension: Secondary | ICD-10-CM | POA: Diagnosis present

## 2014-11-06 DIAGNOSIS — E780 Pure hypercholesterolemia: Secondary | ICD-10-CM | POA: Diagnosis present

## 2014-11-06 DIAGNOSIS — E43 Unspecified severe protein-calorie malnutrition: Secondary | ICD-10-CM | POA: Diagnosis present

## 2014-11-06 DIAGNOSIS — Z515 Encounter for palliative care: Secondary | ICD-10-CM

## 2014-11-06 DIAGNOSIS — R64 Cachexia: Secondary | ICD-10-CM | POA: Diagnosis present

## 2014-11-06 DIAGNOSIS — R570 Cardiogenic shock: Secondary | ICD-10-CM | POA: Diagnosis present

## 2014-11-06 DIAGNOSIS — F411 Generalized anxiety disorder: Secondary | ICD-10-CM | POA: Diagnosis present

## 2014-11-06 DIAGNOSIS — M545 Low back pain: Secondary | ICD-10-CM | POA: Diagnosis present

## 2014-11-06 DIAGNOSIS — F458 Other somatoform disorders: Secondary | ICD-10-CM | POA: Diagnosis present

## 2014-11-06 DIAGNOSIS — R0600 Dyspnea, unspecified: Secondary | ICD-10-CM | POA: Diagnosis present

## 2014-11-06 LAB — URINALYSIS, ROUTINE W REFLEX MICROSCOPIC
Bilirubin Urine: NEGATIVE
Glucose, UA: NEGATIVE mg/dL
Hgb urine dipstick: NEGATIVE
KETONES UR: NEGATIVE mg/dL
Leukocytes, UA: NEGATIVE
NITRITE: NEGATIVE
PH: 5 (ref 5.0–8.0)
Protein, ur: NEGATIVE mg/dL
Specific Gravity, Urine: 1.014 (ref 1.005–1.030)
Urobilinogen, UA: 1 mg/dL (ref 0.0–1.0)

## 2014-11-06 LAB — COMPREHENSIVE METABOLIC PANEL
ALK PHOS: 135 U/L — AB (ref 39–117)
ALT: 507 U/L — AB (ref 0–53)
AST: 793 U/L — AB (ref 0–37)
Albumin: 3.5 g/dL (ref 3.5–5.2)
Anion gap: 25 — ABNORMAL HIGH (ref 5–15)
BILIRUBIN TOTAL: 1.4 mg/dL — AB (ref 0.3–1.2)
BUN: 27 mg/dL — ABNORMAL HIGH (ref 6–23)
CALCIUM: 9.2 mg/dL (ref 8.4–10.5)
CHLORIDE: 98 meq/L (ref 96–112)
CO2: 15 mEq/L — ABNORMAL LOW (ref 19–32)
Creatinine, Ser: 1.14 mg/dL (ref 0.50–1.35)
GFR, EST AFRICAN AMERICAN: 77 mL/min — AB (ref >90)
GFR, EST NON AFRICAN AMERICAN: 67 mL/min — AB (ref >90)
GLUCOSE: 83 mg/dL (ref 70–99)
POTASSIUM: 4.7 meq/L (ref 3.7–5.3)
SODIUM: 138 meq/L (ref 137–147)
Total Protein: 6.7 g/dL (ref 6.0–8.3)

## 2014-11-06 LAB — TROPONIN I: Troponin I: <0.3 ng/mL (ref <0.30)

## 2014-11-06 LAB — I-STAT CG4 LACTIC ACID, ED: LACTIC ACID, VENOUS: 6.93 mmol/L — AB (ref 0.5–2.2)

## 2014-11-06 LAB — PROTIME-INR
INR: 1.51 — AB (ref 0.00–1.49)
Prothrombin Time: 18.4 seconds — ABNORMAL HIGH (ref 11.6–15.2)

## 2014-11-06 LAB — I-STAT VENOUS BLOOD GAS, ED
ACID-BASE DEFICIT: 10 mmol/L — AB (ref 0.0–2.0)
Bicarbonate: 16.2 mEq/L — ABNORMAL LOW (ref 20.0–24.0)
O2 Saturation: 42 %
PH VEN: 7.275 (ref 7.250–7.300)
PO2 VEN: 27 mmHg — AB (ref 30.0–45.0)
TCO2: 17 mmol/L (ref 0–100)
pCO2, Ven: 34.8 mmHg — ABNORMAL LOW (ref 45.0–50.0)

## 2014-11-06 LAB — PRO B NATRIURETIC PEPTIDE: PRO B NATRI PEPTIDE: 16666 pg/mL — AB (ref 0–125)

## 2014-11-06 LAB — LIPASE, BLOOD: Lipase: 7 U/L — ABNORMAL LOW (ref 11–59)

## 2014-11-06 MED ORDER — SODIUM CHLORIDE 0.9 % IV BOLUS (SEPSIS)
1000.0000 mL | Freq: Once | INTRAVENOUS | Status: AC
  Administered 2014-11-06: 1000 mL via INTRAVENOUS

## 2014-11-06 MED ORDER — MORPHINE SULFATE 10 MG/ML IJ SOLN
8.0000 mg/h | INTRAVENOUS | Status: DC
  Administered 2014-11-07: 5 mg/h via INTRAVENOUS
  Administered 2014-11-07: 8 mg/h via INTRAVENOUS
  Filled 2014-11-06 (×3): qty 10

## 2014-11-06 NOTE — ED Notes (Signed)
Pt here from home via EMS with c/o lethargy. Pt family reports that pt has "good and bad days" but has been having "bad day" since yesterday. Pt c/o RUQ pain that comes and goes. Pt was started on dobutamine infusion approx 2 weeks ago for CHF with no issues. Pt is a/o x 4 with NAD noted at this time.

## 2014-11-06 NOTE — Consult Note (Signed)
Cardiology Consult Note  Admit date: 11/16/2014 Name: Leroy Kennedy 63 y.o.  male DOB:  1951/01/05 MRN:  [id number]  Today's date:  11/02/2014  Referring Physician:    [facility]  Reason for Consultation:    Shortness of breath, pain  IMPRESSIONS: 1. Chronic systolic heart failure-end-stage class IV 2. Nonischemic cardiomyopathy 3. Chronic pain syndrome 4. Lung mass undiagnosed possible cancer 5. Transaminitis possible due to heart failure 6. Hypotension  RECOMMENDATION: Discussed with Dr. Haroldine Laws. During the last hospitalization most of the options for management of advanced heart failure were exhausted. He is not a candidate for transplant or further advanced therapies. Palliative care has been involved in his therapy and his prognosis is poor. He will be admitted to the teaching service. We would recommend that he continue his dobutamine overnight. May try diuresis although pressure may limit that. I think comfort care as can be the best thing that we can do to help him and Dr. Haroldine Laws will stop by in the morning.   I would be liberal with the use of pain medications. Morphine may help his dyspnea. Intravenous Lasix may help his dyspnea and he certainly could benefit from oxygen too.  HISTORY: This 63 year old male was recently discharged in the hospital following a extensive evaluation of significant systolic heart failure. He  was diagnosed with a nonischemic cardiomyopathy and had cardiogenic shock. Therapies were limited because of hypotension and he was stopped  On milrinone as well as beta blocker therapy and eventually was discharged on midodrine as well as intravenous dobutamine at home.when last seen in the clinic he was doing fair but had significant issues with anxiety and significant chronic pain due to low back pain. His prognosis was said to be poor and it was uncertain whether he was able to take his medicines properly or not. He returned to the emergency room this  evening complaining of nausea, some vomiting, significant pain involving his back as well as dyspnea and PND. He has generalized pain all over.  Past Medical History  Diagnosis Date  . COPD (chronic obstructive pulmonary disease)   . Hypertension   . High cholesterol   . Chronic bronchitis   . GERD (gastroesophageal reflux disease)   . History of blood transfusion 2010    "had a punctured lung that was full of bile"  . Stroke     "I've had a couple small strokes this year; left me a little weak in RLE"  . Arthritis     "all over"  . Bulging lumbar disc   . CHF (congestive heart failure) dx'd 03/2012      Past Surgical History  Procedure Laterality Date  . Cardiac catheterization  2013  . Tonsillectomy    . Colonoscopy  04/2014     Allergies:  has No Known Allergies.   Medications: Prior to Admission medications   Medication Sig Start Date End Date Taking? Authorizing Provider  ALPRAZolam (XANAX) 1 MG tablet Take 1 tablet (1 mg total) by mouth 3 (three) times daily as needed for anxiety (AIR HUNGER). 10/28/14  Yes [name], MD  aspirin EC 81 MG EC tablet Take 1 tablet (81 mg total) by mouth daily. 10/28/14  Yes [name], MD  furosemide (LASIX) 40 MG tablet Take 1 tablet (40 mg total) by mouth daily as needed (if weight 133lbs or greater). 10/28/14  Yes [name], MD  omeprazole (PRILOSEC) 20 MG capsule Take 20 mg by mouth daily.   Yes Historical Provider, MD  oxyCODONE (OXY IR/ROXICODONE) 5 MG immediate release tablet Take 1 tablet (5 mg total) by mouth every 3 (three) hours as needed for moderate pain, severe pain or breakthrough pain. 10/28/14  Yes [name], MD  oxyCODONE-acetaminophen (PERCOCET/ROXICET) 5-325 MG per tablet Take 1 tablet by mouth every 8 (eight) hours. Patient taking differently: Take 1 tablet by mouth every 8 (eight) hours. For pain 10/28/14  Yes [name], MD  atorvastatin (LIPITOR) 80 MG tablet Take 1 tablet (80 mg  total) by mouth daily at 6 PM. Patient not taking: Reported on 11/16/2014 10/28/14   [name], MD  digoxin (LANOXIN) 0.125 MG tablet Take 1 tablet (0.125 mg total) by mouth daily. Patient not taking: Reported on 11/05/2014 10/28/14   [name], MD  DOBUTamine (DOBUTREX) 4-5 MG/ML-% infusion Inject 178.2 mcg/min into the vein continuous. Patient not taking: Reported on 11/25/2014 10/28/14   [name], MD  midodrine (PROAMATINE) 10 MG tablet Take 1 tablet (10 mg total) by mouth 3 (three) times daily with meals. Patient not taking: Reported on 11/02/2014 10/28/14   [name], MD  senna-docusate (SENOKOT-S) 8.6-50 MG per tablet Take 1 tablet by mouth at bedtime. Patient not taking: Reported on 11/27/2014 10/28/14   [name], MD    Family History: No family status information on file.    Social History:   reports that he has been smoking Cigarettes.  He has a 23 pack-year smoking history. He has never used smokeless tobacco. He reports that he drinks about 4.8 oz of alcohol per week. He reports that he does not use illicit drugs.   History   Social History Narrative   Living in [place] with friends    Review of Systems: Other than as noted above the remainder of the review of systems is unremarkable.  Physical Exam: BP 75/57 mmHg  Pulse 113  Temp(Src) 97.5 F (36.4 C) (Rectal)  Resp 20  Ht 5\' 11"  (1.803 m)  Wt 65.772 kg (145 lb)  BMI 20.23 kg/m2  SpO2 99%  General appearance: cachectic appearing white male appearing older than stated age but in no respiratory distress Head: Normocephalic, without obvious abnormality, atraumatic Neck: no adenopathy, no carotid bruit, supple, symmetrical, trachea midline and JVD elevated Lungs: rales bilaterally, increased AP diameter, kyphotic Heart: apical impulse displaced, 1 to 2/6 murmur, soft S3 heard Abdomen: soft, non-tender; bowel sounds normal; no masses,  no organomegaly Rectal:  deferred Extremities: 2+ edema noted Pulses: 2+ and symmetric Neurologic: Grossly normal  Labs: CBC No results for input(s): WBC, RBC, HGB, HCT, PLT, MCV, MCH, MCHC, RDW, LYMPHSABS, MONOABS, EOSABS, BASOSABS in the last 72 hours.  Invalid input(s): NEUTRABS CMP   Recent Labs  11/16/2014 1954  NA 138  K 4.7  CL 98  CO2 15*  GLUCOSE 83  BUN 27*  CREATININE 1.14  CALCIUM 9.2  PROT 6.7  ALBUMIN 3.5  AST 793*  ALT 507*  ALKPHOS 135*  BILITOT 1.4*  GFRNONAA 67*  GFRAA 77*   BNP (last 3 results)  Recent Labs  10/12/14 1316 11/22/2014 1948  PROBNP 20243.0* 16666.0*   Cardiac Panel (last 3 results)  Recent Labs  11/21/2014 1954  TROPONINI <0.30     Radiology: Cardiomegaly, relatively clear lung fields  EKG: Sinus tachycardia, IV conduction delay, nonspecific ST changes, left axis deviation  Signed:  W. Doristine Church MD [facility]   Cardiology Consultant  11/08/2014, 11:25 PM

## 2014-11-06 NOTE — ED Notes (Signed)
Family Practice MD at bedside

## 2014-11-06 NOTE — ED Provider Notes (Signed)
CSN: [id number]     Arrival date & time 11/01/2014  1941 History   First MD Initiated Contact with Patient 11/18/2014 1946     Chief Complaint  Patient presents with  . Fatigue      HPI  Patient presents 2 days after being discharged from this facility. Patient now complains of weakness, nausea,  Listlessness. Patient has dyspnea as well, though this is unchanged. Patient denies chest pain. Symptoms have been increasing since discharge. No relief with anything. Patient has multiple medical issues, most prominently recently diagnosed lung cancer, in addition to cardiomyopathy requiring continuous dobutamine infusion. Patient states that he has taken all medications since discharge 2 days ago. Patient denies substantial alcohol consumption.   Past Medical History  Diagnosis Date  . COPD (chronic obstructive pulmonary disease)   . Hypertension   . High cholesterol   . Chronic bronchitis   . GERD (gastroesophageal reflux disease)   . History of blood transfusion 2010    "had a punctured lung that was full of bile"  . Stroke     "I've had a couple small strokes this year; left me a little weak in RLE"  . Arthritis     "all over"  . Bulging lumbar disc   . CHF (congestive heart failure) dx'd 03/2012   Past Surgical History  Procedure Laterality Date  . Cardiac catheterization  2013  . Tonsillectomy    . Colonoscopy  04/2014   Family History  Problem Relation Age of Onset  . Alzheimer's disease Mother     deceased  . Alzheimer's disease Father     deceased, HTN, back pain   History  Substance Use Topics  . Smoking status: Current Every Day Smoker -- 0.50 packs/day for 46 years    Types: Cigarettes  . Smokeless tobacco: Never Used  . Alcohol Use: 4.8 oz/week    8 Cans of beer per week    Review of Systems  Constitutional:       Per HPI, otherwise negative  HENT:       Per HPI, otherwise negative  Respiratory:       Per HPI, otherwise negative  Cardiovascular:     Per HPI, otherwise negative  Gastrointestinal: Negative for vomiting.  Endocrine:       Negative aside from HPI  Genitourinary:       Neg aside from HPI   Musculoskeletal:       Per HPI, otherwise negative  Skin: Negative.   Neurological: Positive for weakness and light-headedness. Negative for syncope.      Allergies  Review of patient's allergies indicates no known allergies.  Home Medications   Prior to Admission medications   Medication Sig Start Date End Date Taking? Authorizing Provider  ALPRAZolam (XANAX) 1 MG tablet Take 1 tablet (1 mg total) by mouth 3 (three) times daily as needed for anxiety (AIR HUNGER). 10/28/14  Yes [name], MD  aspirin EC 81 MG EC tablet Take 1 tablet (81 mg total) by mouth daily. 10/28/14  Yes [name], MD  furosemide (LASIX) 40 MG tablet Take 1 tablet (40 mg total) by mouth daily as needed (if weight 133lbs or greater). 10/28/14  Yes [name], MD  omeprazole (PRILOSEC) 20 MG capsule Take 20 mg by mouth daily.   Yes Historical Provider, MD  oxyCODONE (OXY IR/ROXICODONE) 5 MG immediate release tablet Take 1 tablet (5 mg total) by mouth every 3 (three) hours as needed for moderate pain, severe pain or breakthrough pain.  10/28/14  Yes [name], MD  oxyCODONE-acetaminophen (PERCOCET/ROXICET) 5-325 MG per tablet Take 1 tablet by mouth every 8 (eight) hours. Patient taking differently: Take 1 tablet by mouth every 8 (eight) hours. For pain 10/28/14  Yes [name], MD  atorvastatin (LIPITOR) 80 MG tablet Take 1 tablet (80 mg total) by mouth daily at 6 PM. Patient not taking: Reported on 11/03/2014 10/28/14   [name], MD  digoxin (LANOXIN) 0.125 MG tablet Take 1 tablet (0.125 mg total) by mouth daily. Patient not taking: Reported on 11/08/2014 10/28/14   [name], MD  DOBUTamine (DOBUTREX) 4-5 MG/ML-% infusion Inject 178.2 mcg/min into the vein continuous. Patient not taking: Reported on  11/25/2014 10/28/14   [name], MD  midodrine (PROAMATINE) 10 MG tablet Take 1 tablet (10 mg total) by mouth 3 (three) times daily with meals. Patient not taking: Reported on 10/31/2014 10/28/14   [name], MD  senna-docusate (SENOKOT-S) 8.6-50 MG per tablet Take 1 tablet by mouth at bedtime. Patient not taking: Reported on 11/05/2014 10/28/14   [name], MD   BP 81/58 mmHg  Pulse 115  Temp(Src) 97.5 F (36.4 C) (Rectal)  Resp 22  Ht 5\' 11"  (1.803 m)  Wt 145 lb (65.772 kg)  BMI 20.23 kg/m2  SpO2 100% Physical Exam  Constitutional: He is oriented to person, place, and time. He appears cachectic. He has a sickly appearance.  HENT:  Head: Normocephalic and atraumatic.  Eyes: Conjunctivae and EOM are normal.  Cardiovascular: Normal rate and regular rhythm.   Pulmonary/Chest: No stridor. Tachypnea noted. He has decreased breath sounds.  Abdominal: He exhibits no distension. There is no tenderness. There is no rigidity and no guarding.  Musculoskeletal: He exhibits no edema.       Arms: Neurological: He is alert and oriented to person, place, and time.  Skin: Skin is warm and dry.  Psychiatric: He has a normal mood and affect.  Nursing note and vitals reviewed.   ED Course  Procedures (including critical care time) Labs Review Labs Reviewed  COMPREHENSIVE METABOLIC PANEL - Abnormal; Notable for the following:    CO2 15 (*)    BUN 27 (*)    AST 793 (*)    ALT 507 (*)    Alkaline Phosphatase 135 (*)    Total Bilirubin 1.4 (*)    GFR calc non Af Amer 67 (*)    GFR calc Af Amer 77 (*)    Anion gap 25 (*)    All other components within normal limits  LIPASE, BLOOD - Abnormal; Notable for the following:    Lipase 7 (*)    All other components within normal limits  PRO B NATRIURETIC PEPTIDE - Abnormal; Notable for the following:    Pro B Natriuretic peptide (BNP) 16666.0 (*)    All other components within normal limits  PROTIME-INR - Abnormal; Notable  for the following:    Prothrombin Time 18.4 (*)    INR 1.51 (*)    All other components within normal limits  I-STAT CG4 LACTIC ACID, ED - Abnormal; Notable for the following:    Lactic Acid, Venous 6.93 (*)    All other components within normal limits  I-STAT VENOUS BLOOD GAS, ED - Abnormal; Notable for the following:    pCO2, Ven 34.8 (*)    pO2, Ven 27.0 (*)    Bicarbonate 16.2 (*)    Acid-base deficit 10.0 (*)    All other components within normal limits  TROPONIN I  URINALYSIS,  ROUTINE W REFLEX MICROSCOPIC    Imaging Review Dg Chest 2 View  11/26/2014   CLINICAL DATA:  Fatigue. History of CHF. Smoker. History of COPD and hypertension.  EXAM: CHEST  2 VIEW  COMPARISON:  10/17/2014  FINDINGS: Mild enlargement of the cardiac silhouette. No mediastinal or hilar masses. Lungs are hyperexpanded. No lung consolidation or edema. No pleural effusion or pneumothorax.  Right-sided PICC has its tip in the lower superior vena cava at the caval atrial junction.  Bony thorax is demineralized but grossly intact.  IMPRESSION: 1. No acute cardiopulmonary disease. Stable appearance from the prior study.   Electronically Signed   By: Lajean Manes M.D.   On: 11/11/2014 20:36     EKG Interpretation   Date/Time:  Sunday November 06 2014 20:02:21 EST Ventricular Rate:  115 PR Interval:    QRS Duration: 154 QT Interval:  496 QTC Calculation: 686 R Axis:   -111 Text Interpretation:  Junctional tachycardia Nonspecific IVCD with LAD  Consider left ventricular hypertrophy Inferior infarct, acute (LCx) Sinus  tachycardia Non-specific intra-ventricular conduction delay Abnormal ekg  No significant change since last tracing Abnormal ekg Confirmed by  Carmin Muskrat  MD [phone]) on 11/23/2014 8:07:49 PM     On repeat exam the patient appears in similar condition, listless. Patient's blood pressure remains low. Given the patient's lactic acidosis, new evidence for hepatic failure I discussed prognosis,  and therapeutic options with the patient.  Patient reiterates that he is DO NOT RESUSCITATE, does not want to be intubated.  Though the patient's cardiac myopathy, his BNP is slightly diminished since discharge.  Patient will receive fluids for his acidosis.  MDM   Patient with multiple medical problem is, including cardiomyopathy, continuous IV dependency for his heart failure now presents with listlessness, is found to be hypotensive, with new evidence for liver dysfunction, acidosis. Patient's comorbidities prohibit aggressive fluid rotation, but the patient received initial IV fluids in the emergency department. After conversation with the patient about his findings, prognosis, history, patient expressed endorsement of pursuing palliative care options, including hospice consult in the morning.     Carmin Muskrat, MD 11/19/2014 2139

## 2014-11-06 NOTE — ED Notes (Signed)
CG-4 and VBG results reported to Dr. Vanita Panda

## 2014-11-06 NOTE — H&P (Signed)
Leroy Kennedy Admission History and Physical Service Pager: [phone]  Patient name: Leroy Kennedy Medical record number: [id number] Date of birth: 1951/10/02 Age: 63 y.o. Gender: male  Primary Care Provider: No PCP Per Patient Consultants: Cardiology (heart failure) Code Status: DNR  Chief Complaint: dyspnea, pain  Assessment and Plan: Leroy Kennedy is a 63 y.o. male presenting with end-stage CHF and cardio-hepatic disease with desire for comfort care. PMH is significant for CHF with EF of 15%, COPD, Depression, and back pain.  #Comfort care in setting of end-stage CHF and cardio-hepatic disease: now acutely decompensated, multiorgan failure BNP 16666, AST/ALT 793/507 on admission.  Volume overloaded on admission with crackles, JVD, and edema on exam.  Recent echo (10/17) with EF 20-25% and severe hypokinesis.  Patient managed on IV dobutamine, midodrine, and lasix at home.  Patient now requesting comfort care.  - Admit to FPTS, attending [facility] - Cardiology (Heart failure) following, appreciate recs - Will consult Palliative care in AM - Continue home dobutamine, digoxin, midodrine, lasix (consider IV lasix for symptom management if BP can tolerate) - consider stopping in setting of comfort care - Morphine drip for pain management - Increase home xanax to 2mg  TID - O2 prn for symptom management - Monitor on telemetry - consider d/c'ing tomorrow pending Cardiology recs  FEN/GI: Regular diet, SLIV Prophylaxis: lovenox  Disposition: Admit to FPTS, attending Nori Riis. Dispo pending palliative care recs. Suspect there is not much else to offer him from an advanced HF standpoint  History of Present Illness: Leroy Kennedy is a 63 y.o. male presenting with dyspnea.  Patient recently discharged home on 10/30 on dobutamine and midodrine for end-stage CHF.  He was doing well at home and palliative care was coming for home visits.  He reports that for the last 2 days, he has been  feeling very lethargic.  He reports worsening pain in chest, abdomen, and back that is no longer controlled by Percocet.  He has been waking up short of breath and frightened.  He feels as though nothing is working for his situation and would rather just be made comfortable.  Review Of Systems: Per HPI with the following additions: none Otherwise 12 point review of systems was performed and was unremarkable.  Patient Active Problem List   Diagnosis Date Noted  . Chronic systolic heart failure 50/93/2671  . DNR (do not resuscitate) 10/26/2014  . Chronic back pain 10/26/2014  . Anxiety state 10/26/2014  . Inadequate material resources 10/26/2014  . Acute on chronic systolic CHF (congestive heart failure) 10/14/2014  . Cardiogenic shock 10/14/2014  . Protein-calorie malnutrition, severe 10/13/2014  . CHF exacerbation 10/12/2014  . Dyspnea 10/12/2014   Past Medical History: Past Medical History  Diagnosis Date  . COPD (chronic obstructive pulmonary disease)   . Hypertension   . High cholesterol   . Chronic bronchitis   . GERD (gastroesophageal reflux disease)   . History of blood transfusion 2010    "had a punctured lung that was full of bile"  . Stroke     "I've had a couple small strokes this year; left me a little weak in RLE"  . Arthritis     "all over"  . Bulging lumbar disc   . CHF (congestive heart failure) dx'd 03/2012   Past Surgical History: Past Surgical History  Procedure Laterality Date  . Cardiac catheterization  2013  . Tonsillectomy    . Colonoscopy  04/2014   Social History: History  Substance Use Topics  .  Smoking status: Current Every Day Smoker -- 0.50 packs/day for 46 years    Types: Cigarettes  . Smokeless tobacco: Never Used  . Alcohol Use: 4.8 oz/week    8 Cans of beer per week   Additional social history: +tob, -EToH, -drugs  Please also refer to relevant sections of EMR.  Family History: Family History  Problem Relation Age of Onset  .  Alzheimer's disease Mother     deceased  . Alzheimer's disease Father     deceased, HTN, back pain   Allergies and Medications: No Known Allergies No current facility-administered medications on file prior to encounter.   Current Outpatient Prescriptions on File Prior to Encounter  Medication Sig Dispense Refill  . ALPRAZolam (XANAX) 1 MG tablet Take 1 tablet (1 mg total) by mouth 3 (three) times daily as needed for anxiety (AIR HUNGER). 30 tablet 0  . aspirin EC 81 MG EC tablet Take 1 tablet (81 mg total) by mouth daily. 30 tablet 0  . furosemide (LASIX) 40 MG tablet Take 1 tablet (40 mg total) by mouth daily as needed (if weight 133lbs or greater). 30 tablet 0  . omeprazole (PRILOSEC) 20 MG capsule Take 20 mg by mouth daily.    Marland Kitchen oxyCODONE (OXY IR/ROXICODONE) 5 MG immediate release tablet Take 1 tablet (5 mg total) by mouth every 3 (three) hours as needed for moderate pain, severe pain or breakthrough pain. 60 tablet 0  . oxyCODONE-acetaminophen (PERCOCET/ROXICET) 5-325 MG per tablet Take 1 tablet by mouth every 8 (eight) hours. (Patient taking differently: Take 1 tablet by mouth every 8 (eight) hours. For pain) 90 tablet 0  . atorvastatin (LIPITOR) 80 MG tablet Take 1 tablet (80 mg total) by mouth daily at 6 PM. (Patient not taking: Reported on 11/23/2014) 30 tablet 0  . digoxin (LANOXIN) 0.125 MG tablet Take 1 tablet (0.125 mg total) by mouth daily. (Patient not taking: Reported on 11/26/2014) 30 tablet 0  . DOBUTamine (DOBUTREX) 4-5 MG/ML-% infusion Inject 178.2 mcg/min into the vein continuous. (Patient not taking: Reported on 11/03/2014) 250 mL 0  . midodrine (PROAMATINE) 10 MG tablet Take 1 tablet (10 mg total) by mouth 3 (three) times daily with meals. (Patient not taking: Reported on 11/19/2014) 90 tablet 0  . senna-docusate (SENOKOT-S) 8.6-50 MG per tablet Take 1 tablet by mouth at bedtime. (Patient not taking: Reported on 11/22/2014) 30 tablet 0    Objective: BP 85/71 mmHg  Pulse 115   Temp(Src) 97.5 F (36.4 C) (Rectal)  Resp 20  Ht 5\' 11"  (1.803 m)  Wt 145 lb (65.772 kg)  BMI 20.23 kg/m2  SpO2 100% Exam: General: Frail, ill appearing, looks older than stated age HEENT: NCAT, PERRL, EOMI, dry MM Neck: severe kyphosis, JVD to level of mandible Cardiovascular: Tachycardia with many premature beats, no murmurs appreciated Respiratory: Normal WOB, Crackles in bilateral bases Abdomen: Soft, ND, diffusely TTP (R>L), ecchymoses over R lateral ribs Extremities: 2+ pitting edema b/l, no calf tenderness Skin: No rashes appreciated Neuro: CN grossly intact, no focal deficits, speech normal  Labs and Imaging: CBC BMET  No results for input(s): WBC, HGB, HCT, PLT in the last 168 hours.  Recent Labs Lab 11/12/2014 1954  NA 138  K 4.7  CL 98  CO2 15*  BUN 27*  CREATININE 1.14  GLUCOSE 83  CALCIUM 9.2     Urinalysis    Component Value Date/Time   COLORURINE YELLOW 11/25/2014 2146   APPEARANCEUR CLEAR 11/05/2014 2146   LABSPEC 1.014 11/02/2014  2146   PHURINE 5.0 11/05/2014 2146   GLUCOSEU NEGATIVE 11/05/2014 2146   HGBUR NEGATIVE 11/26/2014 2146   BILIRUBINUR NEGATIVE 11/16/2014 2146   KETONESUR NEGATIVE 11/05/2014 2146   PROTEINUR NEGATIVE 11/19/2014 2146   UROBILINOGEN 1.0 11/16/2014 2146   NITRITE NEGATIVE 11/15/2014 2146   LEUKOCYTESUR NEGATIVE 11/22/2014 2146    BNP (last 3 results)  Recent Labs  10/12/14 1316 11/15/2014 1948  PROBNP 20243.0* 16666.0*    VBG: 7.275/34.08/26/15.2  Lactic acid: 6.93  CXR (11/8): 1. No acute cardiopulmonary disease. Stable appearance from the prior study.  EKG (11/8): Junctional Tachycardia  [name], MD 11/23/2014, 11:03 PM PGY-1, [place] Intern pager: [phone], text pages welcome  Agree with excellent by Dr. Brita Romp.We developed the plan that is described in the note and I agree with the content. Additions made in blue  Bernadene Bell, MD Family Medicine  PGY-2 Please page or call with questions

## 2014-11-07 DIAGNOSIS — K219 Gastro-esophageal reflux disease without esophagitis: Secondary | ICD-10-CM | POA: Diagnosis present

## 2014-11-07 DIAGNOSIS — R570 Cardiogenic shock: Secondary | ICD-10-CM

## 2014-11-07 DIAGNOSIS — I5023 Acute on chronic systolic (congestive) heart failure: Secondary | ICD-10-CM | POA: Diagnosis present

## 2014-11-07 DIAGNOSIS — I509 Heart failure, unspecified: Secondary | ICD-10-CM

## 2014-11-07 DIAGNOSIS — C3490 Malignant neoplasm of unspecified part of unspecified bronchus or lung: Secondary | ICD-10-CM | POA: Diagnosis present

## 2014-11-07 DIAGNOSIS — Z6820 Body mass index (BMI) 20.0-20.9, adult: Secondary | ICD-10-CM | POA: Diagnosis not present

## 2014-11-07 DIAGNOSIS — F411 Generalized anxiety disorder: Secondary | ICD-10-CM | POA: Diagnosis present

## 2014-11-07 DIAGNOSIS — G894 Chronic pain syndrome: Secondary | ICD-10-CM | POA: Diagnosis present

## 2014-11-07 DIAGNOSIS — Z66 Do not resuscitate: Secondary | ICD-10-CM | POA: Diagnosis present

## 2014-11-07 DIAGNOSIS — E872 Acidosis, unspecified: Secondary | ICD-10-CM | POA: Insufficient documentation

## 2014-11-07 DIAGNOSIS — Z515 Encounter for palliative care: Secondary | ICD-10-CM | POA: Diagnosis not present

## 2014-11-07 DIAGNOSIS — R740 Nonspecific elevation of levels of transaminase and lactic acid dehydrogenase [LDH]: Secondary | ICD-10-CM | POA: Diagnosis present

## 2014-11-07 DIAGNOSIS — Z79891 Long term (current) use of opiate analgesic: Secondary | ICD-10-CM | POA: Diagnosis not present

## 2014-11-07 DIAGNOSIS — Z7982 Long term (current) use of aspirin: Secondary | ICD-10-CM | POA: Diagnosis not present

## 2014-11-07 DIAGNOSIS — E780 Pure hypercholesterolemia: Secondary | ICD-10-CM | POA: Diagnosis present

## 2014-11-07 DIAGNOSIS — R000 Tachycardia, unspecified: Secondary | ICD-10-CM | POA: Diagnosis present

## 2014-11-07 DIAGNOSIS — Z8673 Personal history of transient ischemic attack (TIA), and cerebral infarction without residual deficits: Secondary | ICD-10-CM | POA: Diagnosis not present

## 2014-11-07 DIAGNOSIS — Z8249 Family history of ischemic heart disease and other diseases of the circulatory system: Secondary | ICD-10-CM | POA: Diagnosis not present

## 2014-11-07 DIAGNOSIS — I429 Cardiomyopathy, unspecified: Secondary | ICD-10-CM | POA: Diagnosis present

## 2014-11-07 DIAGNOSIS — K7290 Hepatic failure, unspecified without coma: Secondary | ICD-10-CM

## 2014-11-07 DIAGNOSIS — J449 Chronic obstructive pulmonary disease, unspecified: Secondary | ICD-10-CM | POA: Diagnosis present

## 2014-11-07 DIAGNOSIS — M545 Low back pain: Secondary | ICD-10-CM | POA: Diagnosis present

## 2014-11-07 DIAGNOSIS — I10 Essential (primary) hypertension: Secondary | ICD-10-CM | POA: Diagnosis present

## 2014-11-07 DIAGNOSIS — I959 Hypotension, unspecified: Secondary | ICD-10-CM | POA: Diagnosis present

## 2014-11-07 DIAGNOSIS — R4020 Unspecified coma: Secondary | ICD-10-CM | POA: Diagnosis present

## 2014-11-07 DIAGNOSIS — F458 Other somatoform disorders: Secondary | ICD-10-CM | POA: Diagnosis present

## 2014-11-07 DIAGNOSIS — F17210 Nicotine dependence, cigarettes, uncomplicated: Secondary | ICD-10-CM | POA: Diagnosis present

## 2014-11-07 DIAGNOSIS — E43 Unspecified severe protein-calorie malnutrition: Secondary | ICD-10-CM | POA: Diagnosis present

## 2014-11-07 DIAGNOSIS — I9589 Other hypotension: Secondary | ICD-10-CM | POA: Diagnosis present

## 2014-11-07 DIAGNOSIS — M1990 Unspecified osteoarthritis, unspecified site: Secondary | ICD-10-CM | POA: Diagnosis present

## 2014-11-07 DIAGNOSIS — I471 Supraventricular tachycardia: Secondary | ICD-10-CM | POA: Diagnosis present

## 2014-11-07 DIAGNOSIS — R64 Cachexia: Secondary | ICD-10-CM | POA: Diagnosis present

## 2014-11-07 LAB — CREATININE, SERUM
Creatinine, Ser: 1.08 mg/dL (ref 0.50–1.35)
GFR calc Af Amer: 82 mL/min — ABNORMAL LOW (ref >90)
GFR, EST NON AFRICAN AMERICAN: 71 mL/min — AB (ref >90)

## 2014-11-07 LAB — CBC
HEMATOCRIT: 33 % — AB (ref 39.0–52.0)
Hemoglobin: 10.3 g/dL — ABNORMAL LOW (ref 13.0–17.0)
MCH: 25.9 pg — ABNORMAL LOW (ref 26.0–34.0)
MCHC: 31.2 g/dL (ref 30.0–36.0)
MCV: 83.1 fL (ref 78.0–100.0)
Platelets: 221 10*3/uL (ref 150–400)
RBC: 3.97 MIL/uL — ABNORMAL LOW (ref 4.22–5.81)
RDW: 25.2 % — AB (ref 11.5–15.5)
WBC: 4.4 10*3/uL (ref 4.0–10.5)

## 2014-11-07 MED ORDER — MIDODRINE HCL 5 MG PO TABS
10.0000 mg | ORAL_TABLET | Freq: Three times a day (TID) | ORAL | Status: DC
  Administered 2014-11-07: 10 mg via ORAL
  Filled 2014-11-07 (×7): qty 2

## 2014-11-07 MED ORDER — ALPRAZOLAM 0.5 MG PO TABS: 2.0000 mg | ORAL_TABLET | Freq: Three times a day (TID) | ORAL | Status: DC | PRN

## 2014-11-07 MED ORDER — DOBUTAMINE IN D5W 4-5 MG/ML-% IV SOLN
2.0000 ug/kg/min | INTRAVENOUS | Status: DC
  Administered 2014-11-07: 3 ug/kg/min via INTRAVENOUS
  Filled 2014-11-07: qty 250

## 2014-11-07 MED ORDER — DIGOXIN 125 MCG PO TABS
0.1250 mg | ORAL_TABLET | Freq: Every day | ORAL | Status: DC
  Administered 2014-11-07: 0.125 mg via ORAL
  Filled 2014-11-07 (×2): qty 1

## 2014-11-07 MED ORDER — PANTOPRAZOLE SODIUM 40 MG PO TBEC
40.0000 mg | DELAYED_RELEASE_TABLET | Freq: Every day | ORAL | Status: DC
  Administered 2014-11-07: 40 mg via ORAL
  Filled 2014-11-07: qty 1

## 2014-11-07 MED ORDER — ENOXAPARIN SODIUM 40 MG/0.4ML ~~LOC~~ SOLN
40.0000 mg | SUBCUTANEOUS | Status: DC
  Administered 2014-11-07: 40 mg via SUBCUTANEOUS
  Filled 2014-11-07 (×3): qty 0.4

## 2014-11-07 MED ORDER — ATORVASTATIN CALCIUM 80 MG PO TABS
80.0000 mg | ORAL_TABLET | Freq: Every day | ORAL | Status: DC
  Filled 2014-11-07 (×2): qty 1

## 2014-11-07 MED ORDER — SENNOSIDES-DOCUSATE SODIUM 8.6-50 MG PO TABS
1.0000 | ORAL_TABLET | Freq: Every day | ORAL | Status: DC
  Filled 2014-11-07 (×2): qty 1

## 2014-11-07 MED ORDER — ONDANSETRON HCL 4 MG/2ML IJ SOLN
4.0000 mg | Freq: Once | INTRAMUSCULAR | Status: AC
  Administered 2014-11-07: 4 mg via INTRAVENOUS
  Filled 2014-11-07: qty 2

## 2014-11-07 MED ORDER — ASPIRIN EC 81 MG PO TBEC
81.0000 mg | DELAYED_RELEASE_TABLET | Freq: Every day | ORAL | Status: DC
  Administered 2014-11-07: 81 mg via ORAL
  Filled 2014-11-07 (×2): qty 1

## 2014-11-07 MED ORDER — FUROSEMIDE 40 MG PO TABS
40.0000 mg | ORAL_TABLET | Freq: Every day | ORAL | Status: DC | PRN
  Filled 2014-11-07: qty 1

## 2014-11-07 MED ORDER — SODIUM CHLORIDE 0.9 % IJ SOLN
3.0000 mL | Freq: Two times a day (BID) | INTRAMUSCULAR | Status: DC
  Administered 2014-11-07 (×2): 3 mL via INTRAVENOUS

## 2014-11-07 NOTE — Progress Notes (Signed)
[facility]  Patient Status: Active pt prior to this readmission  [facility] is providing the following services: [facility] and [facility] for home Dobutamine therapy. [facility] hospital team will follow and support transition home if/when deemed appropriate.   If patient discharges after hours, please call [phone].   Larry Sierras 11/07/2014, 10:26 AM

## 2014-11-07 NOTE — Progress Notes (Signed)
Family Medicine Teaching Service Daily Progress Note Intern Pager: [phone]  Patient name: Leroy Kennedy Medical record number: [id number] Date of birth: 16-May-1951 Age: 63 y.o. Gender: male  Primary Care Provider: No PCP Per Patient Consultants: cardiology (heart failure), Palliative care Code Status: DNR  Pt Overview and Major Events to Date:  11/8 - admit for transition to hospice and comfort care  Assessment and Plan:   Leroy Kennedy is a 63 y.o. male presenting with end-stage CHF and cardio-hepatic disease with desire for comfort care. PMH is significant for CHF with EF of 15%, COPD, Depression, and back pain.  #Comfort care in setting of end-stage CHF and cardio-hepatic disease: now acutely decompensated, multiorgan failure. BNP 16666, AST/ALT 793/507 on admission. Volume overloaded on admission with crackles, JVD, and edema on exam. Recent echo (10/17) with EF 20-25% and severe hypokinesis. Patient managed on IV dobutamine, midodrine, and lasix at home. Patient now requesting comfort care. - Cardiology (Heart failure) following, appreciate recs - Consult Palliative care - Continue home dobutamine, digoxin, midodrine, lasix (consider IV lasix for symptom management if BP can tolerate) - consider stopping in setting of comfort care - Morphine drip for pain management - Continue Xanax 2mg  TID - O2 prn for symptom management - Monitor on telemetry - consider d/c'ing tomorrow pending Cardiology recs  FEN/GI: Regular diet, SLIV Prophylaxis: lovenox  Disposition: Dispo pending palliative care recs. Suspect there is not much else to offer him from an advanced HF standpoint  Subjective:  Pain is under better control, but feels sleepy on morphine drip.  Objective: Temp:  [97.5 F (36.4 C)] 97.5 F (36.4 C) (11/08 2050) Pulse Rate:  [104-115] 106 (11/09 0815) Resp:  [13-22] 18 (11/09 0815) BP: (72-95)/(46-81) 90/68 mmHg (11/09 0815) SpO2:  [87 %-100 %] 87 % (11/09 0815) Weight:  [145  lb (65.772 kg)] 145 lb (65.772 kg) (11/08 1948) Physical Exam: General: Frail, ill appearing, looks older than stated age 38: NCAT, PERRL, EOMI, dry MM Neck: severe kyphosis, JVD to level of mandible Cardiovascular: Tachycardic, no murmurs appreciated Respiratory: Normal WOB, Crackles in bilateral bases Abdomen: Soft, ND, diffusely TTP (R>L), ecchymoses over R lateral ribs Extremities: 2+ pitting edema b/l, no calf tenderness Skin: No rashes appreciated Neuro: CN grossly intact, no focal deficits, speech normal  Laboratory:  Recent Labs Lab 11/07/14 0047  WBC 4.4  HGB 10.3*  HCT 33.0*  PLT 221    Recent Labs Lab 10/30/2014 1954 11/07/14 0047  NA 138  --   K 4.7  --   CL 98  --   CO2 15*  --   BUN 27*  --   CREATININE 1.14 1.08  CALCIUM 9.2  --   PROT 6.7  --   BILITOT 1.4*  --   ALKPHOS 135*  --   ALT 507*  --   AST 793*  --   GLUCOSE 83  --     Urinalysis    Component Value Date/Time   COLORURINE YELLOW 11/05/2014 2146   APPEARANCEUR CLEAR 11/04/2014 2146   LABSPEC 1.014 11/11/2014 2146   PHURINE 5.0 11/28/2014 2146   GLUCOSEU NEGATIVE 11/27/2014 2146   HGBUR NEGATIVE 11/25/2014 2146   BILIRUBINUR NEGATIVE 11/23/2014 2146   KETONESUR NEGATIVE 11/15/2014 2146   PROTEINUR NEGATIVE 11/01/2014 2146   UROBILINOGEN 1.0 11/26/2014 2146   NITRITE NEGATIVE 11/25/2014 2146   LEUKOCYTESUR NEGATIVE 11/27/2014 2146     BNP (last 3 results)  Recent Labs  10/12/14 1316 10/30/2014 1948  PROBNP 20243.0* 16666.0*  VBG: 7.275/34.08/26/15.2  Lactic acid: 6.93  Imaging/Diagnostic Tests: CXR (11/8): 1. No acute cardiopulmonary disease. Stable appearance from the prior study.  EKG (11/8): Junctional Tachycardia  [name], MD 11/07/2014, 8:29 AM PGY-1, [place] Intern pager: [phone], text pages welcome

## 2014-11-07 NOTE — Progress Notes (Signed)
11/07/2014 1000  Pt. Gave two contact numbers to RN for [employer] and Janus Molder. States these are his brothers and would like MD to call them and make them aware of his situation. Request honored and numbers given to teaching service and heart failure team.  Leroy Kennedy, Arville Lime

## 2014-11-07 NOTE — Discharge Summary (Signed)
[facility] Death Summary  Patient name: Leroy Kennedy Medical record number: [id number] Date of birth: [birth year] Age: 63 y.o. Gender: male Date of Admission: 2014-11-14  Date of Death: 2014-11-17 Admitting Physician: Dickie La, MD  Primary Care Provider: No PCP Per Patient Consultants: Heart failure, palliative care  Indication for Hospitalization: end-stage CHF with multi-organ failure  Hospital Diagnoses/Problem List:  End-stage CHF Cardio-hepatic disease Cardiogenic shock Nonischemic cardiomyopathy Chronic pain syndrome  Brief Hospital Course:  [name] is a 63 y.o. male presenting with end-stage CHF and cardio-hepatic disease with desire for comfort care. PMH is significant for CHF with EF of 15%, COPD, Depression, and back pain.  Patient presenting with acutely decompensated end-stage CHF with multiorgan failure and cardio-hepatic disease.  BNP 16666, AST/ALT 793/507 on admission. Volume overloaded on admission with crackles, JVD, and edema on exam. Patient managed on IV dobutamine, midodrine, and lasix at home. Patient requesting comfort care on admission.  Heart failure and palliative care teams consulted on admission.  Patient initially maintained on home dobutamine and midodrine.  After discussing with patient and family and friends, decision made to d/c dobutamine and midodrine.  Patient managed on morphine drip from admission on for pain control and air hunger.  Morphine drip titrated up for comfort, and on 10mg /hr at time of death.  On November 17, 2023, added IV Lasix 80mg  BID for comfort, as patient noted to have increased work of breathing and evidence of fluid overload.  Xanax 1mg  TID available throughout admission for anxiety.  On 11-17-2014 at 0420, RN found patient unresponsive, breathless, and pulseless.  Death confirmed by 2 RNs and myself.  Significant Procedures: none  Significant Labs and Imaging:   Recent Labs Lab 11/07/14 0047  WBC 4.4  HGB  10.3*  HCT 33.0*  PLT 221    Recent Labs Lab 11/14/2014 1954 11/07/14 0047  NA 138  --   K 4.7  --   CL 98  --   CO2 15*  --   GLUCOSE 83  --   BUN 27*  --   CREATININE 1.14 1.08  CALCIUM 9.2  --   ALKPHOS 135*  --   AST 793*  --   ALT 507*  --   ALBUMIN 3.5  --     Lactic acid: 6.93   VBG: 7.275/34.08/26/15.2  CXR 11-15-2023): 1. No acute cardiopulmonary disease. Stable appearance from the prior study.  EKG 11/15/2023): Junctional Tachycardia     [name], MD 11/07/2014, 10:49 AM PGY-1, [place]

## 2014-11-07 NOTE — Progress Notes (Signed)
   Called patients friends and family members to update him on his status.   I called [place], [phone] and Marlou Sa ("adopted niece") [phone] and explained that his prognosis is poor currently. I explained that he has let us know that he would like comfort care and they agreed that that seems to be his wishes lately.   I explained all that they asked and answered their questions, As he asked me to call and update them, and gave them contact information for him.   Laroy Apple, MD [place] Resident, PGY-3 11/07/2014, 10:24 AM

## 2014-11-07 NOTE — ED Notes (Signed)
Report called to floor

## 2014-11-07 NOTE — Progress Notes (Addendum)
Advanced Heart Failure Rounding Note   Subjective:    Feels weak. Pain under better control. Denies SOB. On morphine gtt.     Objective:   Weight Range:  Vital Signs:   Temp:  [97.5 F (36.4 C)] 97.5 F (36.4 C) (11/08 2050) Pulse Rate:  [103-115] 103 (11/09 0930) Resp:  [13-22] 18 (11/09 0815) BP: (69-95)/(46-81) 72/46 mmHg (11/09 1110) SpO2:  [87 %-100 %] 97 % (11/09 0930) Weight:  [65.772 kg (145 lb)] 65.772 kg (145 lb) (11/08 1948)    Weight change: Filed Weights   11/21/2014 1948  Weight: 65.772 kg (145 lb)    Intake/Output:   Intake/Output Summary (Last 24 hours) at 11/07/14 1258 Last data filed at 11/07/14 0718  Gross per 24 hour  Intake      3 ml  Output    200 ml  Net   -197 ml     Physical Exam: General:  Weak appearing. No resp difficulty. Sitting up in bed HEENT: normal Neck: supple. JVP to ear. Carotids 2+ bilat; no bruits. No lymphadenopathy or thryomegaly appreciated. Cor: PMI laterally displaced. Regular rate & rhythm. +s3 Lungs: clear Abdomen: soft, nontender, nondistended. No hepatosplenomegaly. No bruits or masses. Good bowel sounds. Extremities: no cyanosis, clubbing, rash, 1+ edema Neuro: alert & orientedx3, cranial nerves grossly intact. moves all 4 extremities w/o difficulty. Affect pleasant  Telemetry: NSR  Labs: Basic Metabolic Panel:  Recent Labs Lab 11/05/2014 1954 11/07/14 0047  NA 138  --   K 4.7  --   CL 98  --   CO2 15*  --   GLUCOSE 83  --   BUN 27*  --   CREATININE 1.14 1.08  CALCIUM 9.2  --     Liver Function Tests:  Recent Labs Lab 11/22/2014 1954  AST 793*  ALT 507*  ALKPHOS 135*  BILITOT 1.4*  PROT 6.7  ALBUMIN 3.5    Recent Labs Lab 11/08/2014 1954  LIPASE 7*   No results for input(s): AMMONIA in the last 168 hours.  CBC:  Recent Labs Lab 11/07/14 0047  WBC 4.4  HGB 10.3*  HCT 33.0*  MCV 83.1  PLT 221    Cardiac Enzymes:  Recent Labs Lab 11/24/2014 1954  TROPONINI <0.30    BNP: BNP  (last 3 results)  Recent Labs  10/12/14 1316 11/15/2014 1948  PROBNP 20243.0* 16666.0*     Other results:     Imaging: Dg Chest 2 View  11/08/2014   CLINICAL DATA:  Fatigue. History of CHF. Smoker. History of COPD and hypertension.  EXAM: CHEST  2 VIEW  COMPARISON:  10/17/2014  FINDINGS: Mild enlargement of the cardiac silhouette. No mediastinal or hilar masses. Lungs are hyperexpanded. No lung consolidation or edema. No pleural effusion or pneumothorax.  Right-sided PICC has its tip in the lower superior vena cava at the caval atrial junction.  Bony thorax is demineralized but grossly intact.  IMPRESSION: 1. No acute cardiopulmonary disease. Stable appearance from the prior study.   Electronically Signed   By: Lajean Manes M.D.   On: 11/08/2014 20:36      Medications:     Scheduled Medications: . aspirin EC  81 mg Oral Daily  . atorvastatin  80 mg Oral q1800  . digoxin  0.125 mg Oral Daily  . enoxaparin (LOVENOX) injection  40 mg Subcutaneous Q24H  . midodrine  10 mg Oral TID WC  . pantoprazole  40 mg Oral Daily  . senna-docusate  1 tablet Oral QHS  .  sodium chloride  3 mL Intravenous Q12H     Infusions: . DOBUTamine 2 mcg/kg/min (11/07/14 1123)  . morphine 8 mg/hr (11/07/14 1122)     PRN Medications:  ALPRAZolam, furosemide   Assessment:   1. 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 Nonischemic cardiomyopathy 3. Chronic pain syndrome 4. Lung mass undiagnosed  5. Tobacco abuse, ongoing   Plan/Discussion:    He has end-stage HF with cardiogenic shock despite IV dobutamine. With social situation and tobacco use, advanced therapies are not an option. We discussed [place] versus inpatient Hospice. He would like to continue dobutamine for palliation and continue with morphine gtt. He realizes he will not survive this hospitalization. Titrate morphine to comfort. We will try  to contact family.   I called Janus Molder - contact info provided by nurse but he said he was not Nada Boozer Overholser's brother.    Length of Stay: 1   Glori Bickers MD 11/07/2014, 12:58 PM  Advanced Heart Failure Team Pager [phone] (M-F; 7a - 4p)  Please contact [facility] for night-coverage after hours (4p -7a ) and weekends on [email or web address]

## 2014-11-07 NOTE — ED Notes (Signed)
Spoke to rapid response about pt going to 2000 it is ok

## 2014-11-08 MED ORDER — FUROSEMIDE 10 MG/ML IJ SOLN
80.0000 mg | Freq: Two times a day (BID) | INTRAMUSCULAR | Status: DC
Start: 2014-11-08 — End: 2014-11-09
  Administered 2014-11-08 (×2): 80 mg via INTRAVENOUS
  Filled 2014-11-08 (×4): qty 8

## 2014-11-08 MED ORDER — ALPRAZOLAM 0.5 MG PO TABS: 1.0000 mg | ORAL_TABLET | Freq: Three times a day (TID) | ORAL | Status: DC | PRN

## 2014-11-08 MED ORDER — SODIUM CHLORIDE 0.9 % IV SOLN
10.0000 mg/h | INTRAVENOUS | Status: DC
  Administered 2014-11-08: 10 mg/h via INTRAVENOUS
  Administered 2014-11-08: 8 mg/h via INTRAVENOUS
  Filled 2014-11-08 (×3): qty 10

## 2014-11-08 NOTE — Progress Notes (Signed)
Family Medicine Teaching Service Daily Progress Note Intern Pager: [phone]  Patient name: Leroy Kennedy Medical record number: [id number] Date of birth: [birth year] Age: 63 y.o. Gender: male  Primary Care Provider: No PCP Per Patient Consultants: cardiology (heart failure), Palliative care Code Status: DNR  Pt Overview and Major Events to Date:  11/8 - admit for transition to hospice and comfort care  Assessment and Plan:   Leroy Kennedy is a 63 y.o. male presenting with end-stage CHF and cardio-hepatic disease with desire for comfort care. PMH is significant for CHF with EF of 15%, COPD, Depression, and back pain.  #Comfort care in setting of end-stage CHF and cardio-hepatic disease: now acutely decompensated, multiorgan failure. BNP 16666, AST/ALT 793/507 on admission. Volume overloaded on admission with crackles, JVD, and edema on exam. Recent echo (10/17) with EF 20-25% and severe hypokinesis. Patient managed on IV dobutamine, midodrine, and lasix at home. Patient now requesting comfort care. - Cardiology (Heart failure) following, appreciate recs - Palliative care following, appreciate recs - Cards d/c'd dobutamine - IV Lasix 80mg  BID for comfort - Increase Morphine drip to 10mg /hr for pain management and air hunger - Continue Xanax 2mg  TID - O2 prn for symptom management - Family contacted yesterday  FEN/GI: Regular diet, SLIV Prophylaxis: none  Disposition: Dispo pending palliative care recs - options likely [facility] vs inpatient hospice. Suspect there is not much else to offer him from an advanced HF standpoint  Subjective:  Pain is well controlled. Still talkative, somewhat confused.  Reports difficulty breathing.  Objective: Temp:  [97.5 F (36.4 C)] 97.5 F (36.4 C) (11/10 0441) Pulse Rate:  [103-113] 113 (11/10 0441) BP: (69-72)/(46-53) 72/46 mmHg (11/09 1110) SpO2:  [88 %-97 %] 88 % (11/10 0441) Physical Exam: General: Frail, ill appearing, looks older than  stated age 37: NCAT, PERRL, EOMI, dry MM Neck: severe kyphosis, JVD to level of mandible Cardiovascular: Tachycardic, no murmurs appreciated Respiratory:  Crackles in bilateral bases, increasing  Abdomen: Soft, ND, diffusely TTP (R>L), ecchymoses over R lateral ribs Extremities: 2+ pitting edema b/l, no calf tenderness Skin: No rashes appreciated Neuro: CN grossly intact, no focal deficits, speech normal  Laboratory:  Recent Labs Lab 11/07/14 0047  WBC 4.4  HGB 10.3*  HCT 33.0*  PLT 221    Recent Labs Lab 11/25/2014 1954 11/07/14 0047  NA 138  --   K 4.7  --   CL 98  --   CO2 15*  --   BUN 27*  --   CREATININE 1.14 1.08  CALCIUM 9.2  --   PROT 6.7  --   BILITOT 1.4*  --   ALKPHOS 135*  --   ALT 507*  --   AST 793*  --   GLUCOSE 83  --     Urinalysis    Component Value Date/Time   COLORURINE YELLOW 11/16/2014 2146   APPEARANCEUR CLEAR 11/12/2014 2146   LABSPEC 1.014 11/19/2014 2146   PHURINE 5.0 11/23/2014 2146   GLUCOSEU NEGATIVE 11/16/2014 2146   HGBUR NEGATIVE 11/24/2014 2146   BILIRUBINUR NEGATIVE 11/20/2014 2146   KETONESUR NEGATIVE 11/05/2014 2146   PROTEINUR NEGATIVE 11/17/2014 2146   UROBILINOGEN 1.0 11/12/2014 2146   NITRITE NEGATIVE 11/08/2014 2146   LEUKOCYTESUR NEGATIVE 11/14/2014 2146     BNP (last 3 results)  Recent Labs  10/12/14 1316 11/02/2014 1948  PROBNP 20243.0* 16666.0*    VBG: 7.275/34.08/26/15.2  Lactic acid: 6.93  Imaging/Diagnostic Tests: CXR (11/8): 1. No acute cardiopulmonary disease. Stable appearance from the  prior study.  EKG (11/8): Junctional Tachycardia   [name], MD 11/08/2014, 9:13 AM PGY-1, [place] Intern pager: [phone], text pages welcome

## 2014-11-08 NOTE — Progress Notes (Addendum)
Advanced Heart Failure Rounding Note   Subjective:    Patient on morphine gtt 8 mg/hr for comfort care. Essentially non-responsive.     Objective:   Weight Range:  Vital Signs:   Temp:  [97.5 F (36.4 C)] 97.5 F (36.4 C) (11/10 0441) Pulse Rate:  [103-113] 113 (11/10 0441) Resp:  [17-21] 18 (11/09 0815) BP: (69-90)/(46-72) 72/46 mmHg (11/09 1110) SpO2:  [87 %-97 %] 88 % (11/10 0441)    Weight change: Filed Weights   11/11/2014 1948  Weight: 145 lb (65.772 kg)    Intake/Output:  No intake or output data in the 24 hours ending 11/08/14 0726   Physical Exam: General:  Nonresponsive HEENT: normal Neck: supple. JVP to ear. Carotids 2+ bilat; no bruits. No lymphadenopathy or thryomegaly appreciated. Cor: PMI laterally displaced. Regular rate & rhythm. +s3 Lungs: clear Abdomen: soft, nontender, nondistended. No hepatosplenomegaly. No bruits or masses. Good bowel sounds. Extremities: no cyanosis, clubbing, rash, 1+ edema Neuro: comatose  Telemetry: ST  Labs: Basic Metabolic Panel:  Recent Labs Lab 11/25/2014 1954 11/07/14 0047  NA 138  --   K 4.7  --   CL 98  --   CO2 15*  --   GLUCOSE 83  --   BUN 27*  --   CREATININE 1.14 1.08  CALCIUM 9.2  --     Liver Function Tests:  Recent Labs Lab 11/25/2014 1954  AST 793*  ALT 507*  ALKPHOS 135*  BILITOT 1.4*  PROT 6.7  ALBUMIN 3.5    Recent Labs Lab 11/27/2014 1954  LIPASE 7*   No results for input(s): AMMONIA in the last 168 hours.  CBC:  Recent Labs Lab 11/07/14 0047  WBC 4.4  HGB 10.3*  HCT 33.0*  MCV 83.1  PLT 221    Cardiac Enzymes:  Recent Labs Lab 11/18/2014 1954  TROPONINI <0.30    BNP: BNP (last 3 results)  Recent Labs  10/12/14 1316 11/25/2014 1948  PROBNP 20243.0* 16666.0*     Other results:     Imaging: Dg Chest 2 View  11/03/2014   CLINICAL DATA:  Fatigue. History of CHF. Smoker. History of COPD and hypertension.  EXAM: CHEST  2 VIEW  COMPARISON:  10/17/2014   FINDINGS: Mild enlargement of the cardiac silhouette. No mediastinal or hilar masses. Lungs are hyperexpanded. No lung consolidation or edema. No pleural effusion or pneumothorax.  Right-sided PICC has its tip in the lower superior vena cava at the caval atrial junction.  Bony thorax is demineralized but grossly intact.  IMPRESSION: 1. No acute cardiopulmonary disease. Stable appearance from the prior study.   Electronically Signed   By: Lajean Manes M.D.   On: 11/27/2014 20:36     Medications:     Scheduled Medications: . aspirin EC  81 mg Oral Daily  . atorvastatin  80 mg Oral q1800  . digoxin  0.125 mg Oral Daily  . enoxaparin (LOVENOX) injection  40 mg Subcutaneous Q24H  . midodrine  10 mg Oral TID WC  . pantoprazole  40 mg Oral Daily  . senna-docusate  1 tablet Oral QHS  . sodium chloride  3 mL Intravenous Q12H    Infusions: . DOBUTamine 2 mcg/kg/min (11/07/14 1123)  . morphine 8 mg/hr (11/08/14 0629)    PRN Medications: ALPRAZolam, furosemide   Assessment:   1. 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 Nonischemic cardiomyopathy 3. Chronic  pain syndrome 4. Lung mass undiagnosed  5. Tobacco abuse, ongoing   Plan/Discussion:    He has end-stage HF with cardiogenic shock despite IV dobutamine. With social situation and tobacco use, advanced therapies are not an option. We discussed [place] versus inpatient Hospice. He would like to continue dobutamine for palliation and continue with morphine gtt. He realizes he will not survive this hospitalization.   Morphine gtt started yesterday 8 mg/hr. Pain improved and patient more lethargic. SBP 60-90s. Will stop all non-essential medications. Continue dobutamine for palliation. Placed DNR order on chart.   Stop VSS and make comfort care.   Length of Stay: 2  Rande Brunt NP-C 11/08/2014, 7:26 AM  Advanced Heart Failure Team Pager  [phone] (M-F; 7a - 4p)  Please contact [facility] for night-coverage after hours (4p -7a ) and weekends on [email or web address]  Patient seen and examined with Junie Bame, NP. We discussed all aspects of the encounter. I agree with the assessment and plan as stated above.   He is now comatose. I spoke with him and his friends last night at length and he was clear that he was suffering from severe pain and end-stage HF. He was clear that he wanted comfort measures and he knew he would not survive this admission. Will continue morphine gtt. Can stop dobutamine.   Rodrigues Urbanek,MD 8:53 AM   Addendum: patient more alert. Able to converse. + dyspnea. I spoke with Dr. Brita Romp. Agree with IV lasix and increasing morphine for comfort. Can stop dobutamine.   Rajveer Handler,MD 10:12 AM

## 2014-11-08 NOTE — Progress Notes (Signed)
Mr. Damien is arousable but appears comfortable with no signs of pain or distress on continuous morphine infusion. Social work has already contacted hospice facility for placement and the goals have already been established for comfort. I will sign off. Please call [phone] for any acute palliative needs. Thank you.   Vinie Sill, NP Palliative Medicine Team Pager # [phone] (M-F 8a-5p) Team Phone # [phone] (Nights/Weekends)

## 2014-11-08 NOTE — Clinical Social Work Note (Addendum)
Spoke to patient who is in agreement to going to hospice if a place can be found.  [facility] and spoke to hospital liaison and she said she will take a look at patient.  Spoke to hospice admissions for [facility] at [facility] who said she will look at patient as well.  Spoke to [facility] admissions who said to fax information on patient and they will look at him.  Patient had friends in his room [name] Dredge contact number [phone], cell [phone], and his other friend Feliberto Harts contact number [phone] which is also his roommate.  Patient's friends said if patient needs to go to hospice they would like him to go to [facility] since that is closer to where he is living.  [facility] gave information to facility if patient is able to be transferred.  [facility] will contact CSW in morning.  Patient's friends requested a chaplain to come and pray for patient.  Paged chaplain who said someone from chaplain department will try to come later this evening.  Jones Broom. Cass, MSW, [place] 11/08/2014 2:08 PM

## 2014-11-08 NOTE — Clinical Documentation Improvement (Signed)
Supporting Information: Hypotension noted per 11/08 progress notes.  Labs: 11/08: lactic acid: 6.93.   Possible Diagnosis? . Sepsis-specify causative organism if known . Sepsis due to: --Device --Implant --Graft --Infusion  . Severe sepsis-sepsis with organ dysfunction --Specify organ dysfunction Respiratory failure Encephalopathy Acute kidney failure Other (specify) . SIRS (Systemic Inflammatory Response Syndrome --With or without organ dysfunction . Document septic shock if present . Document any associated diagnoses/conditions    Thank Sherian Maroon Documentation Specialist [phone] [email or web address] [email or web address]

## 2014-11-15 ENCOUNTER — Encounter: Payer: Self-pay | Admitting: Internal Medicine

## 2014-11-17 ENCOUNTER — Encounter (HOSPITAL_COMMUNITY): Payer: Self-pay

## 2014-11-29 NOTE — Progress Notes (Signed)
Called by RN around 0430 and notified that patient found unresponsive, pulseless and breathless.  Saw patient and confirmed no heart beat and no respirations.  Filled out death certificate. RNs to call friends.  Will be available to talk to friends if they desire.  [name], MD, MPH PGY-1,  [facility] 11/11/2014 4:54 AM

## 2014-11-29 NOTE — Progress Notes (Addendum)
Agree with note below.  Pt DNR, no pulse, no respirations, and unresponsive. Expiration confirmed by Nolon Nations, RN and Fuller Mandril, RN;  MD notified and confirmed expiration; Time of Death [phone] on 12/09/14; [facility] notified Ref. # [id number], pt belongings left at nursing station for friends to pick-up.  Friends/Roommate notified. Leroy Kennedy [phone] 09-Dec-2014) and Leroy Kennedy [phone] 2014-12-09); Both aware of pt prognosis prior to expiration; per phone conversation they indicated okay to transfer to morgue and will follow up later.    Body taken to the morgue: [place], RN

## 2014-11-29 DEATH — deceased

## 2014-12-08 ENCOUNTER — Encounter (HOSPITAL_COMMUNITY): Payer: Self-pay | Admitting: Internal Medicine
